# Patient Record
Sex: Female | Born: 1955 | Hispanic: Yes | Marital: Married | Smoking: Never smoker
Health system: Southern US, Community
[De-identification: ages and names within clinical notes are randomized; demographics above are authoritative.]

## PROBLEM LIST (undated history)

## (undated) DIAGNOSIS — E119 Type 2 diabetes mellitus without complications: Secondary | ICD-10-CM

## (undated) DIAGNOSIS — I10 Essential (primary) hypertension: Secondary | ICD-10-CM

## (undated) DIAGNOSIS — E079 Disorder of thyroid, unspecified: Secondary | ICD-10-CM

## (undated) HISTORY — PX: ABDOMINAL HYSTERECTOMY: SHX81

---

## 2004-04-15 ENCOUNTER — Ambulatory Visit: Payer: Self-pay | Admitting: Family Medicine

## 2004-05-14 ENCOUNTER — Ambulatory Visit: Payer: Self-pay | Admitting: Family Medicine

## 2005-06-02 ENCOUNTER — Ambulatory Visit: Payer: Self-pay | Admitting: Family Medicine

## 2006-08-18 ENCOUNTER — Ambulatory Visit: Payer: Self-pay | Admitting: Family Medicine

## 2011-11-28 ENCOUNTER — Emergency Department: Payer: Self-pay | Admitting: Emergency Medicine

## 2011-12-02 ENCOUNTER — Ambulatory Visit: Payer: Self-pay | Admitting: Specialist

## 2011-12-02 LAB — BASIC METABOLIC PANEL
Anion Gap: 12 (ref 7–16)
Calcium, Total: 9.2 mg/dL (ref 8.5–10.1)
Chloride: 100 mmol/L (ref 98–107)
Co2: 26 mmol/L (ref 21–32)
Creatinine: 0.55 mg/dL — ABNORMAL LOW (ref 0.60–1.30)
EGFR (Non-African Amer.): >60
Glucose: 203 mg/dL — ABNORMAL HIGH (ref 65–99)

## 2011-12-02 LAB — HEMOGLOBIN: HGB: 14.9 g/dL (ref 12.0–16.0)

## 2011-12-09 ENCOUNTER — Ambulatory Visit: Payer: Self-pay | Admitting: Specialist

## 2012-09-29 ENCOUNTER — Ambulatory Visit: Payer: Self-pay | Admitting: Family Medicine

## 2012-11-07 ENCOUNTER — Ambulatory Visit: Payer: Self-pay | Admitting: Family Medicine

## 2013-08-10 ENCOUNTER — Ambulatory Visit: Payer: Self-pay | Admitting: Family Medicine

## 2013-08-22 DIAGNOSIS — I10 Essential (primary) hypertension: Secondary | ICD-10-CM | POA: Insufficient documentation

## 2013-08-22 DIAGNOSIS — E0590 Thyrotoxicosis, unspecified without thyrotoxic crisis or storm: Secondary | ICD-10-CM | POA: Insufficient documentation

## 2013-11-22 ENCOUNTER — Other Ambulatory Visit (HOSPITAL_COMMUNITY): Payer: Self-pay | Admitting: Family Medicine

## 2013-11-22 ENCOUNTER — Other Ambulatory Visit: Payer: Self-pay | Admitting: Cardiology

## 2014-01-08 DIAGNOSIS — E785 Hyperlipidemia, unspecified: Secondary | ICD-10-CM | POA: Insufficient documentation

## 2014-07-03 NOTE — Op Note (Signed)
PATIENT NAME:  Katherine Walters, [name] MR#:  [id number] DATE OF BIRTH:  [birth year]  DATE OF PROCEDURE:  12/09/2011  PREOPERATIVE DIAGNOSIS: Comminuted fracture, right patella.   POSTOPERATIVE DIAGNOSIS: Comminuted fracture, right patella.  PROCEDURE: Open reduction internal fixation right patella with removal of several bone fragments.   SURGEON: Clare Gandyhristopher E. Izaiah Tabb, MD   ANESTHESIA: General.   COMPLICATIONS: None.   TOURNIQUET TIME: Approximately one hour.   DESCRIPTION OF PROCEDURE: 2 grams of Ancef were given intravenously prior to the procedure. General anesthesia is induced. Right lower extremity is thoroughly prepped with alcohol and ChloraPrep and draped in standard sterile fashion. Extremity is wrapped out with the Esmarch bandage and pneumatic tourniquet elevated to 325 mmHg. Transverse incision is made and the dissection carried down to the patella which is seen to be extremely comminuted. Two separate fragments were removed which were felt not to be able to be internally fixed. The joint is thoroughly irrigated multiple times of all clot. The two remaining large pieces of bone which are connected to the quadriceps tendon and the patellar tendon are retained feeling that bone healing could be obtained. The rongeur is used to shape these two ends as flat as possible and then internal fixation is performed with two longitudinal K wires with a circumferential 18-gauge wire with the ends twisted on each side. The undersurface of the patella is then palpated and there is felt to be as good as possible apposition of articular surface as could be obtained. The retinaculum is then thoroughly repaired with #5 Ethibond. The ends of the wires and the ends of the pins are buried as much as possible into the soft tissues. Wound is thoroughly irrigated multiple times again. The proximal portion of the wound is infiltrated with 30 mL of Marcaine with epinephrine. The prepatellar bursa is repaired over top of the  repair and then subcutaneous tissue is closed with 2-0 Vicryl. Skin is closed with the skin stapler. Soft bulky dressing is applied. Tourniquet is released. Knee immobilizer is applied.   The patient is returned to the recovery room in satisfactory condition having tolerated the procedure quite well.   ____________________________ Clare Gandyhristopher E. Orestes Geiman, MD ces:drc D: 12/09/2011 11:39:28 ET T: 12/09/2011 13:17:29 ET JOB#: [id number]  cc: Clare Gandyhristopher E. Eean Buss, MD, <Dictator> Clare GandyHRISTOPHER E [place] Yeats MD ELECTRONICALLY SIGNED 12/10/2011 8:34

## 2014-10-22 ENCOUNTER — Ambulatory Visit
Admission: RE | Admit: 2014-10-22 | Discharge: 2014-10-22 | Disposition: A | Discharge status: Home / Self Care | Payer: No Typology Code available for payment source | Source: Ambulatory Visit | Attending: Family Medicine | Admitting: Family Medicine

## 2014-10-22 ENCOUNTER — Other Ambulatory Visit: Payer: Self-pay | Admitting: Family Medicine

## 2014-10-22 DIAGNOSIS — Z1231 Encounter for screening mammogram for malignant neoplasm of breast: Secondary | ICD-10-CM | POA: Diagnosis not present

## 2014-12-14 DIAGNOSIS — Z794 Long term (current) use of insulin: Secondary | ICD-10-CM | POA: Insufficient documentation

## 2015-06-13 ENCOUNTER — Other Ambulatory Visit: Payer: Self-pay | Admitting: Family Medicine

## 2015-06-13 DIAGNOSIS — Z1231 Encounter for screening mammogram for malignant neoplasm of breast: Secondary | ICD-10-CM

## 2015-10-23 ENCOUNTER — Ambulatory Visit: Payer: No Typology Code available for payment source | Attending: Family Medicine

## 2018-10-10 ENCOUNTER — Other Ambulatory Visit: Payer: Self-pay | Admitting: Physician Assistant

## 2018-10-10 DIAGNOSIS — Z1231 Encounter for screening mammogram for malignant neoplasm of breast: Secondary | ICD-10-CM

## 2019-02-15 ENCOUNTER — Other Ambulatory Visit: Payer: Self-pay | Admitting: Physician Assistant

## 2019-02-15 DIAGNOSIS — Z1231 Encounter for screening mammogram for malignant neoplasm of breast: Secondary | ICD-10-CM

## 2019-09-06 ENCOUNTER — Ambulatory Visit
Admission: RE | Admit: 2019-09-06 | Discharge: 2019-09-06 | Disposition: A | Discharge status: Home / Self Care | Payer: Medicare Other | Source: Ambulatory Visit | Attending: Physician Assistant | Admitting: Physician Assistant

## 2019-09-06 DIAGNOSIS — Z1231 Encounter for screening mammogram for malignant neoplasm of breast: Secondary | ICD-10-CM

## 2019-11-16 ENCOUNTER — Other Ambulatory Visit: Payer: Self-pay | Admitting: Physician Assistant

## 2019-11-16 DIAGNOSIS — Z13820 Encounter for screening for osteoporosis: Secondary | ICD-10-CM

## 2020-08-22 ENCOUNTER — Other Ambulatory Visit: Payer: Self-pay | Admitting: Physician Assistant

## 2020-08-22 ENCOUNTER — Other Ambulatory Visit (INDEPENDENT_AMBULATORY_CARE_PROVIDER_SITE_OTHER): Payer: Self-pay | Admitting: Nurse Practitioner

## 2020-08-22 ENCOUNTER — Other Ambulatory Visit: Payer: Self-pay | Admitting: Primary Care

## 2020-08-22 DIAGNOSIS — I83893 Varicose veins of bilateral lower extremities with other complications: Secondary | ICD-10-CM

## 2020-08-22 DIAGNOSIS — Z1231 Encounter for screening mammogram for malignant neoplasm of breast: Secondary | ICD-10-CM

## 2020-08-23 ENCOUNTER — Ambulatory Visit (INDEPENDENT_AMBULATORY_CARE_PROVIDER_SITE_OTHER): Payer: Medicare Other | Admitting: Nurse Practitioner

## 2020-08-23 ENCOUNTER — Other Ambulatory Visit: Payer: Self-pay

## 2020-08-23 ENCOUNTER — Ambulatory Visit (INDEPENDENT_AMBULATORY_CARE_PROVIDER_SITE_OTHER): Payer: Medicare Other

## 2020-08-23 VITALS — BP 124/85 | HR 84 | Ht 63.0 in | Wt 190.0 lb

## 2020-08-23 DIAGNOSIS — I10 Essential (primary) hypertension: Secondary | ICD-10-CM | POA: Diagnosis not present

## 2020-08-23 DIAGNOSIS — Z794 Long term (current) use of insulin: Secondary | ICD-10-CM

## 2020-08-23 DIAGNOSIS — I83893 Varicose veins of bilateral lower extremities with other complications: Secondary | ICD-10-CM | POA: Diagnosis not present

## 2020-08-23 DIAGNOSIS — E119 Type 2 diabetes mellitus without complications: Secondary | ICD-10-CM

## 2020-08-25 ENCOUNTER — Encounter (INDEPENDENT_AMBULATORY_CARE_PROVIDER_SITE_OTHER): Payer: Self-pay | Admitting: Nurse Practitioner

## 2020-08-25 NOTE — Progress Notes (Signed)
Subjective:    Patient ID: Katherine Walters, female    DOB: Aug 27, 1955, 65 y.o.   MRN: [id number] Chief Complaint  Patient presents with   New Patient (Initial Visit)    Ven reflux  and consult     The patient is seen for evaluation of symptomatic varicose veins. The patient relates burning and stinging which worsened steadily throughout the course of the day, particularly with standing. The patient also notes an aching and throbbing pain over the varicosities, particularly with prolonged dependent positions. The symptoms are significantly improved with elevation.  The patient also notes that during hot weather the symptoms are greatly intensified. The patient states the pain from the varicose veins interferes with work, daily exercise, shopping and household maintenance. At this point, the symptoms are persistent and severe enough that they're having a negative impact on lifestyle and are interfering with daily activities.  The patient notes that on her right lower extremity there is an area of particular tenderness.  She notes that this area was red and painful but that has subsided mostly at this time.  There is no history of DVT, PE or superficial thrombophlebitis. There is no history of ulceration or hemorrhage. The patient denies a significant family history of varicose veins.  The patient has not worn graduated compression in the past. At the present time the patient has not been using over-the-counter analgesics. There is no history of prior surgical intervention or sclerotherapy.  Today noninvasive studies show no evidence of DVT seen bilaterally.  The patient does have evidence of reflux in the bilateral great saphenous veins at the knee and proximal calf.  It is also noted that in the distal thigh there is a partially occlusive subacute thrombus in the varicosity originating from the great saphenous vein.   Review of Systems  Cardiovascular:  Positive for leg swelling.  Skin:   Positive for color change.  All other systems reviewed and are negative.     Objective:   Physical Exam Vitals reviewed.  HENT:     Head: Normocephalic.  Cardiovascular:     Rate and Rhythm: Normal rate.     Pulses: Normal pulses.  Pulmonary:     Effort: Pulmonary effort is normal.  Musculoskeletal:        General: Tenderness present.  Skin:    General: Skin is warm and dry.  Neurological:     Mental Status: She is alert and oriented to person, place, and time.  Psychiatric:        Mood and Affect: Mood normal.        Behavior: Behavior normal.        Thought Content: Thought content normal.        Judgment: Judgment normal.    BP 124/85   Pulse 84   Ht 5\' 3"  (1.6 m)   Wt 190 lb (86.2 kg)   BMI 33.66 kg/m   History reviewed. No pertinent past medical history.  Social History   Socioeconomic History   Marital status: Married    Spouse name: Not on file   Number of children: Not on file   Years of education: Not on file   Highest education level: Not on file  Occupational History   Not on file  Tobacco Use   Smoking status: Never   Smokeless tobacco: Never  Substance and Sexual Activity   Alcohol use: Not on file   Drug use: Not on file   Sexual activity: Not on file  Other Topics Concern   Not on file  Social History Narrative   Not on file   Social Determinants of Health   Financial Resource Strain: Not on file  Food Insecurity: Not on file  Transportation Needs: Not on file  Physical Activity: Not on file  Stress: Not on file  Social Connections: Not on file  Intimate Partner Violence: Not on file    Past Surgical History:  Procedure Laterality Date   ABDOMINAL HYSTERECTOMY      History reviewed. No pertinent family history.  Allergies  Allergen Reactions   Iodine Other (See Comments)    CBC Latest Ref Rng & Units 12/02/2011  Hemoglobin 12.0 - 16.0 g/dL 16.1      CMP     Component Value Date/Time   NA 138 12/02/2011 1219   K  3.4 (L) 12/02/2011 1219   CL 100 12/02/2011 1219   CO2 26 12/02/2011 1219   GLUCOSE 203 (H) 12/02/2011 1219   BUN 12 12/02/2011 1219   CREATININE 0.55 (L) 12/02/2011 1219   CALCIUM 9.2 12/02/2011 1219   GFRNONAA >60 12/02/2011 1219   GFRAA >60 12/02/2011 1219     No results found.     Assessment & Plan:   1. Varicose veins of bilateral lower extremities with other complications  Recommend:  The patient has large symptomatic varicose veins that are painful and associated with swelling.  I have had a long discussion with the patient regarding  varicose veins and why they cause symptoms.  Patient will begin wearing graduated compression stockings class 1 on a daily basis, beginning first thing in the morning and removing them in the evening. The patient is instructed specifically not to sleep in the stockings.    The patient  will also begin using over-the-counter analgesics such as Motrin 600 mg po TID to help control the symptoms.    In addition, behavioral modification including elevation during the day will be initiated.    Pending the results of these changes the  patient will be reevaluated in three months.   An  ultrasound of the venous system will be obtained.   The patient is also advised to utilize full dose aspirin for the next 2 weeks to help with resolution of the thrombophlebitis.  Further plans will be based on the ultrasound results and whether conservative therapies are successful at eliminating the pain and swelling.    2. Type 2 diabetes mellitus without complication, with long-term current use of insulin (HCC) Continue hypoglycemic medications as already ordered, these medications have been reviewed and there are no changes at this time.  Hgb A1C to be monitored as already arranged by primary service   3. Benign essential hypertension Continue antihypertensive medications as already ordered, these medications have been reviewed and there are no changes at this  time.    Current Outpatient Medications on File Prior to Visit  Medication Sig Dispense Refill   aspirin 81 MG chewable tablet Chew by mouth.     cetirizine (ZYRTEC) 10 MG tablet Take by mouth.     glyBURIDE-metformin (GLUCOVANCE) 5-500 MG tablet Take 1 tablet by mouth 2 (two) times daily.     insulin NPH Human (NOVOLIN N) 100 UNIT/ML injection INJECT 10 TO 40 UNITS SUBCUTANEOUSLY TWICE DAILY WITH MEALS.     lisinopril-hydrochlorothiazide (ZESTORETIC) 20-25 MG tablet TAKE 1 TABLET BY MOUTH ONCE DAILY NEEDS  FOLLOW  UP  FOR  FURTHER  REFILLS     methimazole (TAPAZOLE) 10 MG tablet Take  1 tablet by mouth 2 (two) times daily.     OZEMPIC, 0.25 OR 0.5 MG/DOSE, 2 MG/1.5ML SOPN Inject 0.5 mg into the skin once a week.     terbinafine (LAMISIL) 250 MG tablet Take 250 mg by mouth daily.     triamcinolone cream (KENALOG) 0.1 % SMARTSIG:1 Sparingly Topical Twice Daily     No current facility-administered medications on file prior to visit.    There are no Patient Instructions on file for this visit. No follow-ups on file.   Georgiana Spinner, NP

## 2020-09-06 ENCOUNTER — Other Ambulatory Visit: Payer: Self-pay

## 2020-09-06 ENCOUNTER — Ambulatory Visit
Admission: RE | Admit: 2020-09-06 | Discharge: 2020-09-06 | Disposition: A | Discharge status: Home / Self Care | Payer: Medicare Other | Source: Ambulatory Visit | Attending: Primary Care | Admitting: Primary Care

## 2020-09-06 DIAGNOSIS — Z1231 Encounter for screening mammogram for malignant neoplasm of breast: Secondary | ICD-10-CM | POA: Insufficient documentation

## 2020-11-25 ENCOUNTER — Other Ambulatory Visit (INDEPENDENT_AMBULATORY_CARE_PROVIDER_SITE_OTHER): Payer: Self-pay | Admitting: Nurse Practitioner

## 2020-11-25 DIAGNOSIS — I8001 Phlebitis and thrombophlebitis of superficial vessels of right lower extremity: Secondary | ICD-10-CM

## 2020-11-26 ENCOUNTER — Ambulatory Visit (INDEPENDENT_AMBULATORY_CARE_PROVIDER_SITE_OTHER): Payer: Medicare Other | Admitting: Nurse Practitioner

## 2020-11-26 ENCOUNTER — Encounter (INDEPENDENT_AMBULATORY_CARE_PROVIDER_SITE_OTHER): Payer: Medicare Other

## 2021-07-08 ENCOUNTER — Other Ambulatory Visit: Payer: Self-pay | Admitting: Physician Assistant

## 2021-07-08 DIAGNOSIS — Z1231 Encounter for screening mammogram for malignant neoplasm of breast: Secondary | ICD-10-CM

## 2021-09-09 ENCOUNTER — Ambulatory Visit
Admission: RE | Admit: 2021-09-09 | Discharge: 2021-09-09 | Disposition: A | Discharge status: Home / Self Care | Payer: Medicare Other | Source: Ambulatory Visit | Attending: Physician Assistant | Admitting: Physician Assistant

## 2021-09-09 DIAGNOSIS — Z1231 Encounter for screening mammogram for malignant neoplasm of breast: Secondary | ICD-10-CM | POA: Diagnosis present

## 2022-01-12 ENCOUNTER — Encounter (INDEPENDENT_AMBULATORY_CARE_PROVIDER_SITE_OTHER): Payer: Self-pay

## 2022-05-21 ENCOUNTER — Encounter: Payer: Self-pay | Admitting: *Deleted

## 2022-06-10 ENCOUNTER — Encounter: Payer: Self-pay | Admitting: Internal Medicine

## 2022-06-10 ENCOUNTER — Ambulatory Visit: Payer: Medicare Other | Admitting: Internal Medicine

## 2022-06-10 VITALS — BP 120/80 | HR 81 | Ht 63.0 in | Wt 200.0 lb

## 2022-06-10 DIAGNOSIS — E042 Nontoxic multinodular goiter: Secondary | ICD-10-CM

## 2022-06-10 DIAGNOSIS — E0590 Thyrotoxicosis, unspecified without thyrotoxic crisis or storm: Secondary | ICD-10-CM

## 2022-06-10 DIAGNOSIS — M810 Age-related osteoporosis without current pathological fracture: Secondary | ICD-10-CM

## 2022-06-10 LAB — T4, FREE: Free T4: 1.23 ng/dL (ref 0.60–1.60)

## 2022-06-10 LAB — CBC
HCT: 43.5 % (ref 36.0–46.0)
Hemoglobin: 14.5 g/dL (ref 12.0–15.0)
MCHC: 33.4 g/dL (ref 30.0–36.0)
MCV: 83.4 fl (ref 78.0–100.0)
Platelets: 184 10*3/uL (ref 150.0–400.0)
RBC: 5.22 Mil/uL — ABNORMAL HIGH (ref 3.87–5.11)
RDW: 13.8 % (ref 11.5–15.5)
WBC: 8.2 10*3/uL (ref 4.0–10.5)

## 2022-06-10 LAB — VITAMIN D 25 HYDROXY (VIT D DEFICIENCY, FRACTURES): VITD: 20.44 ng/mL — ABNORMAL LOW (ref 30.00–100.00)

## 2022-06-10 LAB — COMPREHENSIVE METABOLIC PANEL
ALT: 18 U/L (ref 0–35)
AST: 21 U/L (ref 0–37)
Albumin: 4.1 g/dL (ref 3.5–5.2)
Alkaline Phosphatase: 155 U/L — ABNORMAL HIGH (ref 39–117)
BUN: 20 mg/dL (ref 6–23)
CO2: 28 mEq/L (ref 19–32)
Calcium: 9.2 mg/dL (ref 8.4–10.5)
Chloride: 104 mEq/L (ref 96–112)
Creatinine, Ser: 0.64 mg/dL (ref 0.40–1.20)
GFR: 90.62 mL/min (ref >60.00)
Glucose, Bld: 105 mg/dL — ABNORMAL HIGH (ref 70–99)
Potassium: 4 mEq/L (ref 3.5–5.1)
Sodium: 140 mEq/L (ref 135–145)
Total Bilirubin: 0.6 mg/dL (ref 0.2–1.2)
Total Protein: 7 g/dL (ref 6.0–8.3)

## 2022-06-10 LAB — TSH: TSH: 0 u[IU]/mL — ABNORMAL LOW (ref 0.35–5.50)

## 2022-06-10 MED ORDER — RISEDRONATE SODIUM 35 MG PO TABS: 35.0000 mg | ORAL_TABLET | ORAL | 3 refills | Status: DC

## 2022-06-10 NOTE — Progress Notes (Unsigned)
Name: Katherine Walters  MRN/ DOB: [id number], 1955/06/09    Age/ Sex: 67 y.o., female    PCP: Kerri Perches, Hershal Coria   Reason for Endocrinology Evaluation: Osteoporosis     Date of Initial Endocrinology Evaluation: 06/10/2022     HPI: Ms. Katherine Walters is a 67 y.o. female with a past medical history of HTN, DM, MNG, GERD. The patient presented for initial endocrinology clinic visit on 06/10/2022 for consultative assistance with her osteoporosis.    She is accompanied by an interpretor   Pt was diagnosed with osteoporosis: 08/2022  Menarche at age : 79 Menopausal at age : hysterectomy at age 89 Fracture Hx: Right patellar fracture after a slip injury  Hx of HRT: no FH of osteoporosis or hip fracture: no Prior Hx of anti-estrogenic therapy :no Prior Hx of anti-resorptive therapy : Alendronate 08/2021 but stopped it due GERD   She takes calcium incorporated in her lipid lowering agent Denies taking extra calcium or vitamin D  She is not on MVI     THYROID HISTORY: Of note, the patient has been diagnosed with multinodular goiter based on ultrasound 10/2012.  The largest hypoechoic nodule in the left inferior thyroid pole 2.5 x 2.9 x 4 cm .  She underwent thyroid uptake and scan 07/2013 with large hyperfunctioning nodule within the left lobe of the thyroid gland.  With suppression of the right lobe of the thyroid gland.   The patient is on methimazole 10 mg daily ( Prescription states BID)  She has been on Methimazole for 3 years Denies local neck swelling  Has occasional palpitations but no tremors  Denies loose stools or diarrhea  Denies Fh of thyroid disease     HISTORY:  Past Medical History: No past medical history on file. Past Surgical History:  Past Surgical History:  Procedure Laterality Date   ABDOMINAL HYSTERECTOMY      Social History:  reports that she has never smoked. She has never used smokeless tobacco. Family History: family history is not on  file.   HOME MEDICATIONS: Allergies as of 06/10/2022       Reactions   Iodine Other (See Comments)        Medication List        Accurate as of June 10, 2022 12:01 PM. If you have any questions, ask your nurse or doctor.          STOP taking these medications    glyBURIDE-metformin 5-500 MG tablet Commonly known as: GLUCOVANCE Stopped by: Dorita Sciara, MD   insulin NPH Human 100 UNIT/ML injection Commonly known as: NOVOLIN N Stopped by: Dorita Sciara, MD   Ozempic (0.25 or 0.5 MG/DOSE) 2 MG/1.5ML Sopn Generic drug: Semaglutide(0.25 or 0.5MG /DOS) Stopped by: Dorita Sciara, MD       TAKE these medications    aspirin 81 MG chewable tablet Chew by mouth.   cetirizine 10 MG tablet Commonly known as: ZYRTEC Take by mouth.   lisinopril-hydrochlorothiazide 20-25 MG tablet Commonly known as: ZESTORETIC TAKE 1 TABLET BY MOUTH ONCE DAILY NEEDS  FOLLOW  UP  FOR  FURTHER  REFILLS   methimazole 10 MG tablet Commonly known as: TAPAZOLE Take 1 tablet by mouth 2 (two) times daily.   terbinafine 250 MG tablet Commonly known as: LAMISIL Take 250 mg by mouth daily.   triamcinolone cream 0.1 % Commonly known as: KENALOG SMARTSIG:1 Sparingly Topical Twice Daily   Victoza 18 MG/3ML Sopn Generic drug: liraglutide Inject  1.2 mg into the skin daily.          REVIEW OF SYSTEMS: A comprehensive ROS was conducted with the patient and is negative except as per HPI    OBJECTIVE:  VS: BP 120/80 (BP Location: Left Arm, Patient Position: Sitting, Cuff Size: Large)   Pulse 81   Ht 5\' 3"  (1.6 m)   Wt 200 lb (90.7 kg)   SpO2 99%   BMI 35.43 kg/m    Wt Readings from Last 3 Encounters:  06/10/22 200 lb (90.7 kg)  08/23/20 190 lb (86.2 kg)     EXAM: General: Pt appears well and is in NAD  Eyes: External eye exam normal without stare, lid lag or exophthalmos.  EOM intact.  Neck: General: Supple without adenopathy. Thyroid: Thyroid size  normal.  No goiter or nodules appreciated. No thyroid bruit.  Lungs: Clear with good BS bilat with no rales, rhonchi, or wheezes  Heart: Auscultation: RRR.  Abdomen: Normoactive bowel sounds, soft, nontender, without masses or organomegaly palpable  Extremities:  BL LE: No pretibial edema normal ROM and strength.  Mental Status: Judgment, insight: Intact Orientation: Oriented to time, place, and person Mood and affect: No depression, anxiety, or agitation     DATA REVIEWED: ***   DXA 08/14/2021 @[facility] [phone] AP spine -2.2 Left femoral neck -2.5 Left total hip -0.8  ASSESSMENT/PLAN/RECOMMENDATIONS:   Osteoporosis    Medications :   2.Hyperthyroidism    3. MNG:  - NO locak neck symptoms    Signed electronically by: Mack Guise, MD  [facility]  [facility] [address] [place], [place] [address] Phone: [phone] FAX: [phone]   CC: [address] [place] [address] Phone: [phone] Fax: [phone]   Return to Endocrinology clinic as below: No future appointments.

## 2022-06-10 NOTE — Patient Instructions (Addendum)
  Tome tabletas de calcio de venta libre de 600 mg [employer] al da o consuma 2-3 porciones de calcio diettico a travs de los alimentos (leche baja en [place], [place], yogur o vegetales de hojas verdes).   Por favor, comience con Actonel (risedronato) 35 mg una vez a la semana.      Please either take over the counter calcium tablets 600 mg twice daily or consume 2-3 servings of dietary calcium through food ( Low fat milk, cheese, yogurt or green leafy vegetables)   Please Start Actonel ( Risedronate) 35 mg once weekly

## 2022-06-11 ENCOUNTER — Telehealth: Payer: Self-pay | Admitting: Internal Medicine

## 2022-06-11 LAB — PARATHYROID HORMONE, INTACT (NO CA): PTH: 37 pg/mL (ref 16–77)

## 2022-06-11 LAB — T3: T3, Total: 129 ng/dL (ref 76–181)

## 2022-06-11 MED ORDER — VITAMIN D3 50 MCG (2000 UT) PO CAPS: 2000.0000 [IU] | ORAL_CAPSULE | Freq: Every day | ORAL | 2 refills | Status: AC

## 2022-06-11 MED ORDER — METHIMAZOLE 10 MG PO TABS: 10.0000 mg | ORAL_TABLET | Freq: Two times a day (BID) | ORAL | 2 refills | Status: DC

## 2022-06-11 NOTE — Telephone Encounter (Signed)
Please let the patient know that her thyroid is overactive, and she needs to increase methimazole to 2 tablets daily   Please let the patient know that her vitamin D is low, and she needs to start over-the-counter vitamin D at 2000 IU daily   Thanks

## 2022-06-11 NOTE — Telephone Encounter (Signed)
Notified pt with interpreter--regarding  lab results and instruction. Pt verbalized understanding. Pt stated-risedronate (ACTONEL) 35 MG --cost $135.00 with insurance and pt questioning if any alternatives. Please advise.

## 2022-06-22 ENCOUNTER — Other Ambulatory Visit (HOSPITAL_COMMUNITY): Payer: Self-pay

## 2022-06-22 ENCOUNTER — Ambulatory Visit
Admission: RE | Admit: 2022-06-22 | Discharge: 2022-06-22 | Disposition: A | Discharge status: Home / Self Care | Payer: Medicare Other | Source: Ambulatory Visit | Attending: Internal Medicine | Admitting: Internal Medicine

## 2022-06-22 ENCOUNTER — Telehealth: Payer: Self-pay

## 2022-06-22 DIAGNOSIS — E042 Nontoxic multinodular goiter: Secondary | ICD-10-CM

## 2022-06-22 NOTE — Telephone Encounter (Signed)
Patient Advocate Encounter   Received notification from pt msgs that prior authorization is required for Risedronate Sodium 35MG  dr tablets  Submitted: 06/22/22 Key [id number]  Status is pending

## 2022-06-23 ENCOUNTER — Telehealth: Payer: Self-pay | Admitting: Internal Medicine

## 2022-06-23 DIAGNOSIS — E042 Nontoxic multinodular goiter: Secondary | ICD-10-CM

## 2022-06-23 NOTE — Telephone Encounter (Signed)
Pharmacy Patient Advocate Encounter  Prior Authorization for Risedronate Sodium 35MG  dr tablets  has been approved by [employer] (ins).    PA # [id number] Effective dates: 06/22/22 through 03/16/23

## 2022-06-23 NOTE — Telephone Encounter (Signed)
Please let the patient know that her thyroid ultrasound continues to show that she has thyroid nodules.   2 of the nodules need to be biopsied  They will usually do this at the same place where they had ultrasound, local anesthesia will be applied but she will remain awake through the process.  Usually the results take approximately 1 week and we would let her know about it    Please take permission from the patient to proceed with biopsy of the 2 thyroid nodules  Please let me know so I can place the orders  Thank you

## 2022-06-24 NOTE — Telephone Encounter (Signed)
Spoke with patient and her daughter and she would like to do biopsy

## 2022-06-26 NOTE — Telephone Encounter (Signed)
FNA orders placed

## 2022-07-07 ENCOUNTER — Ambulatory Visit
Admission: RE | Admit: 2022-07-07 | Discharge: 2022-07-07 | Disposition: A | Discharge status: Home / Self Care | Payer: Medicare Other | Source: Ambulatory Visit | Attending: Internal Medicine | Admitting: Internal Medicine

## 2022-07-07 ENCOUNTER — Other Ambulatory Visit (HOSPITAL_COMMUNITY)
Admission: RE | Admit: 2022-07-07 | Discharge: 2022-07-07 | Disposition: A | Discharge status: Home / Self Care | Payer: Medicare Other | Source: Ambulatory Visit | Attending: Interventional Radiology | Admitting: Interventional Radiology

## 2022-07-07 DIAGNOSIS — E042 Nontoxic multinodular goiter: Secondary | ICD-10-CM

## 2022-07-07 DIAGNOSIS — R896 Abnormal cytological findings in specimens from other organs, systems and tissues: Secondary | ICD-10-CM | POA: Diagnosis not present

## 2022-07-07 DIAGNOSIS — E041 Nontoxic single thyroid nodule: Secondary | ICD-10-CM | POA: Diagnosis present

## 2022-07-08 LAB — CYTOLOGY - NON PAP

## 2022-07-21 ENCOUNTER — Encounter (HOSPITAL_COMMUNITY): Payer: Self-pay

## 2022-08-19 ENCOUNTER — Telehealth: Payer: Self-pay

## 2022-08-19 NOTE — Telephone Encounter (Signed)
Pt received letter to schedule colonoscopy please return call 

## 2022-08-21 ENCOUNTER — Other Ambulatory Visit: Payer: Self-pay | Admitting: Physician Assistant

## 2022-08-21 ENCOUNTER — Ambulatory Visit: Payer: Medicare Other | Admitting: Internal Medicine

## 2022-08-21 ENCOUNTER — Encounter: Payer: Self-pay | Admitting: Internal Medicine

## 2022-08-21 VITALS — BP 138/88 | HR 89 | Ht 63.0 in | Wt 199.4 lb

## 2022-08-21 DIAGNOSIS — E042 Nontoxic multinodular goiter: Secondary | ICD-10-CM

## 2022-08-21 DIAGNOSIS — M810 Age-related osteoporosis without current pathological fracture: Secondary | ICD-10-CM

## 2022-08-21 DIAGNOSIS — E0590 Thyrotoxicosis, unspecified without thyrotoxic crisis or storm: Secondary | ICD-10-CM

## 2022-08-21 DIAGNOSIS — Z1231 Encounter for screening mammogram for malignant neoplasm of breast: Secondary | ICD-10-CM

## 2022-08-21 LAB — BASIC METABOLIC PANEL
BUN: 21 mg/dL (ref 6–23)
CO2: 27 mEq/L (ref 19–32)
Calcium: 9.4 mg/dL (ref 8.4–10.5)
Chloride: 102 mEq/L (ref 96–112)
Creatinine, Ser: 0.76 mg/dL (ref 0.40–1.20)
GFR: 80.24 mL/min (ref >60.00)
Glucose, Bld: 156 mg/dL — ABNORMAL HIGH (ref 70–99)
Potassium: 4.2 mEq/L (ref 3.5–5.1)
Sodium: 140 mEq/L (ref 135–145)

## 2022-08-21 LAB — VITAMIN D 25 HYDROXY (VIT D DEFICIENCY, FRACTURES): VITD: 41.71 ng/mL (ref 30.00–100.00)

## 2022-08-21 LAB — TSH: TSH: 0.4 u[IU]/mL (ref 0.35–5.50)

## 2022-08-21 LAB — T4, FREE: Free T4: 0.57 ng/dL — ABNORMAL LOW (ref 0.60–1.60)

## 2022-08-21 MED ORDER — ALENDRONATE SODIUM 70 MG PO TABS: 70.0000 mg | ORAL_TABLET | ORAL | 3 refills | Status: AC

## 2022-08-21 NOTE — Progress Notes (Unsigned)
Name: Katherine Walters  MRN/ DOB: [id number], 12-30-1955    Age/ Sex: 67 y.o., female    PCP: Marya Fossa, Cordelia Poche   Reason for Endocrinology Evaluation: Osteoporosis     Date of Initial Endocrinology Evaluation: 06/10/2022    HPI: Katherine Walters is a 67 y.o. female with a past medical history of HTN, DM, MNG, GERD. The patient presented for initial endocrinology clinic visit on 06/10/2022 for consultative assistance with her osteoporosis.     Pt was diagnosed with osteoporosis: 08/2022  Menarche at age : 38 Menopausal at age : hysterectomy at age 31 Fracture Hx: Right patellar fracture after a slip injury  Hx of HRT: no FH of osteoporosis or hip fracture: no Prior Hx of anti-estrogenic therapy :no Prior Hx of anti-resorptive therapy : Alendronate 08/2021 but stopped it due GERD   She takes calcium incorporated in her lipid lowering agent Denies taking extra calcium or vitamin D  She is not on MVI     THYROID HISTORY: Of note, the patient has been diagnosed with multinodular goiter based on ultrasound 10/2012.  The largest hypoechoic nodule in the left inferior thyroid pole 2.5 x 2.9 x 4 cm .  She underwent thyroid uptake and scan 07/2013 with large hyperfunctioning nodule within the left lobe of the thyroid gland.  With suppression of the right lobe of the thyroid gland.   She has been on methimazole since 2021   Denies Fh of thyroid disease     SUBJECTIVE:    Today (08/21/22):  Katherine Walters is here for follow-up on osteoporosis and multinodular goiter.  She is accompanied by her daughter and spouse  Denies local neck swelling  Has occasional palpitations  Denies diarrhea but has occasional constipation   She received calcium through the foot   Denies loose stools or diarrhea    Methimazole 10 mg twice daily Risedronate 35 mg weekly Vitamin D3 4000 IU daily  HISTORY:  Past Medical History: No past medical history on file. Past Surgical  History:  Past Surgical History:  Procedure Laterality Date   ABDOMINAL HYSTERECTOMY      Social History:  reports that she has never smoked. She has never used smokeless tobacco. Family History: family history is not on file.   HOME MEDICATIONS: Allergies as of 08/21/2022       Reactions   Iodine Other (See Comments)        Medication List        Accurate as of August 21, 2022  9:51 AM. If you have any questions, ask your nurse or doctor.          aspirin 81 MG chewable tablet Chew by mouth.   cetirizine 10 MG tablet Commonly known as: ZYRTEC Take by mouth.   lisinopril-hydrochlorothiazide 20-25 MG tablet Commonly known as: ZESTORETIC TAKE 1 TABLET BY MOUTH ONCE DAILY NEEDS  FOLLOW  UP  FOR  FURTHER  REFILLS   methimazole 10 MG tablet Commonly known as: TAPAZOLE Take 1 tablet (10 mg total) by mouth 2 (two) times daily.   risedronate 35 MG tablet Commonly known as: Actonel Take 1 tablet (35 mg total) by mouth every 7 (seven) days. with water on empty stomach, nothing by mouth or lie down for next 30 minutes.   terbinafine 250 MG tablet Commonly known as: LAMISIL Take 250 mg by mouth daily.   triamcinolone cream 0.1 % Commonly known as: KENALOG SMARTSIG:1 Sparingly Topical Twice Daily  Victoza 18 MG/3ML Sopn Generic drug: liraglutide Inject 1.2 mg into the skin daily.   Vitamin D3 50 MCG (2000 UT) capsule Take 1 capsule (2,000 Units total) by mouth daily.          REVIEW OF SYSTEMS: A comprehensive ROS was conducted with the patient and is negative except as per HPI    OBJECTIVE:  VS: There were no vitals taken for this visit.   Wt Readings from Last 3 Encounters:  06/10/22 200 lb (90.7 kg)  08/23/20 190 lb (86.2 kg)     EXAM: General: Pt appears well and is in NAD  Eyes: External eye exam normal without stare, lid lag or exophthalmos.  EOM in  Neck: General: Supple without adenopathy. Thyroid: Unable to appreciate any  goiter. No thyroid  bruit.  Lungs: Clear with good BS bilat   Heart: Auscultation: RRR.  Abdomen:  soft, nontender  Extremities:  BL LE: No pretibial edema normal  Mental Status: Judgment, insight: Intact Orientation: Oriented to time, place, and person Mood and affect: No depression, anxiety, or agitation     DATA REVIEWED:   Latest Reference Range & Units 08/21/22 10:17  Sodium 135 - 145 mEq/L 140  Potassium 3.5 - 5.1 mEq/L 4.2  Chloride 96 - 112 mEq/L 102  CO2 19 - 32 mEq/L 27  Glucose 70 - 99 mg/dL 161 (H)  BUN 6 - 23 mg/dL 21  Creatinine 0.96 - 0.45 mg/dL 4.09  Calcium 8.4 - 81.1 mg/dL 9.4  GFR >91.47 mL/min 80.24  VITD 30.00 - 100.00 ng/mL 41.71     Latest Reference Range & Units 08/21/22 10:17  TSH 0.35 - 5.50 uIU/mL 0.40  T4,Free(Direct) 0.60 - 1.60 ng/dL 8.29 (L)  (L): Data is abnormally low     DXA 08/14/2021 @[facility] [phone] AP spine -2.2 Left femoral neck -2.5 Left total hip -0.8    Thyroid ultrasound 06/22/2022 FINDINGS: Parenchymal Echotexture: Moderately heterogenous   Isthmus: 0.2 cm   Right lobe: 4.2 x 1.2 x 1.4 cm   Left lobe: 6.0 x 3.1 x 3.0 cm   _________________________________________________________   Estimated total number of nodules >/= 1 cm: 4   Number of spongiform nodules >/=  2 cm not described below (TR1): 0   Number of mixed cystic and solid nodules >/= 1.5 cm not described below (TR2): 0   _________________________________________________________   Nodule # 1:   Prior biopsy: No   Location: Isthmus; Mid, left   Maximum size: 3.4 cm; Other 2 dimensions: 1.3 x 2.8 cm, previously, 0.7 x 0.4 x 0.8 cm   Composition: solid/almost completely solid (2)   Echogenicity: hypoechoic (2)   Shape: not taller-than-wide (0)   Margins: smooth (0)   Echogenic foci: none (0)   ACR TI-RADS total points: 4.   ACR TI-RADS risk category:  TR4 (4-6 points).   Significant change in size (>/= 20% in two dimensions and  minimal increase of 2 mm): Yes   Change in features: No   Change in ACR TI-RADS risk category: No   ACR TI-RADS recommendations:   **Given size (>/= 1.5 cm) and appearance, fine needle aspiration of this moderately suspicious nodule should be considered based on TI-RADS criteria.   _________________________________________________________   Nodule #2 is a hypoechoic nodule along the right side of the isthmus that measures 0.8 x 0.2 x 0.5 cm. This is a TR 4 nodule. Given size (<0.9 cm) and appearance, this nodule does NOT meet TI-RADS criteria for biopsy or  dedicated follow-up.   Nodule #3 is a cystic nodule in the superior right thyroid lobe that measures up to 0.8 cm and does not require dedicated follow-up.   Nodule #4 is a mixed cystic and solid nodule in the mid right thyroid lobe that measures 1.2 x 0.7 x 0.8 cm. Solid component is isoechoic. This nodule does not require dedicated follow-up.   Nodule # 5:   Prior biopsy: No   Location: Left; mid/superior   Maximum size: 1.5 cm; Other 2 dimensions: 1.4 x 0.8 cm, previously, 1.3 x 0.9 x 1.4 cm   Composition: solid/almost completely solid (2)   Echogenicity: hypoechoic (2)   Shape: not taller-than-wide (0)   Margins: smooth (0)   Echogenic foci: macrocalcifications (1)   ACR TI-RADS total points: 5.   ACR TI-RADS risk category:  TR4 (4-6 points).   Significant change in size (>/= 20% in two dimensions and minimal increase of 2 mm): No   Change in features: No   Change in ACR TI-RADS risk category: No   ACR TI-RADS recommendations:   Nodule has minimally changed since 2014 and most compatible with a benign nodule.   _________________________________________________________   Nodule # 6:   Prior biopsy: No   Location: Left; Inferior   Maximum size: 4.7 cm; Other 2 dimensions: 2.1 x 3.4 cm, previously, 4.0 x 2.9 x 2.5 cm   Composition: solid/almost completely solid (2)   Echogenicity:  hypoechoic (2)   Shape: not taller-than-wide (0)   Margins: smooth (0)   Echogenic foci: macrocalcifications (1)   ACR TI-RADS total points: 5.   ACR TI-RADS risk category:  TR4 (4-6 points).   Significant change in size (>/= 20% in two dimensions and minimal increase of 2 mm): Yes   Change in features: No   Change in ACR TI-RADS risk category: No   ACR TI-RADS recommendations:   **Given size (>/= 1.5 cm) and appearance, fine needle aspiration of this moderately suspicious nodule should be considered based on TI-RADS criteria.   _________________________________________________________   IMPRESSION: 1. Multinodular goiter. 2. Nodule #1 along the left side of the isthmus has significantly enlarged since 2014. This is a TR 4 nodule and recommend ultrasound-guided fine-needle aspiration of this nodule. 3. Nodule #6 in the inferior left thyroid lobe has slightly enlarged since 2014. Consider ultrasound-guided biopsy of this nodule. 4. Nodule #5 in the left mid thyroid is a TR 4 nodule but has not significantly changed since 2014. This is most likely a benign nodule.    FNA Left  isthmic 3.4 cm nodule 07/07/2022  Clinical History: Isthmus; Mid, Left 3.4cm;  Other 2 dimensions: 1.3 x  2.8cm, previously 0.7 x 0.4 x 0.8cm, solid/almost completely solid  hypoechoic TI-RADS - 4  Specimen Submitted:  A. 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 THYROID, LEFT INFERIOR, FINE NEEDLE ASPIRATION:    FINAL MICROSCOPIC DIAGNOSIS:  - Atypia of undetermined significance (Bethesda category III)    Afirma  Benign   ASSESSMENT/PLAN/RECOMMENDATIONS:   Osteoporosis  -We emphasized the importance of optimizing calcium and vitamin D - she was started on alendronate  through her PCP in 2023, she initially did not recall being on any medications for osteoporosis but she eventually stated that she did develop GI side effects -During her initial visit with me we discussed zoledronic acid versus Prolia, but she opted with Actonel, she eventually opted to try alendronate again due to the cost of Actonel -BMP, and vitamin D normal  Medications : Calcium 1200 mg daily Stop risedronate 35 mg weekly Start alendronate 70 mg weekly    2.Hyperthyroidism  -Per patient, she has been on methimazole for a few years -TSH normalized, but free T4 is low, will decrease methimazole as below    Medication Decrease methimazole 10 mg, 1.5 tab  daily  3. MNG:  - NO locak neck symptoms  -She was diagnosed with multinodular goiter in 2014, and uptake and scan showed a toxic left thyroid nodule -She is s/p isthmic 3.4 cm and left inferior 4.7 cm nodules/2024 with atypia of undetermined significance (Bethesda category IV) , Afirma was benign for both     4.  Vitamin D insufficiency:  -Resolved  Continue OTC vitamin D3 4000 IU daily   Follow-up in 6 months   Signed electronically by: Lyndle Herrlich, MD  [facility] Endocrinology  [facility] [address] [place] [place], [place] [address] Phone: [phone] FAX: [phone]   CC: Cyndi Bender [address] [place] [address] Phone: [phone] Fax: [phone]   Return to Endocrinology clinic as below: Future Appointments  Date Time Provider Department Center  09/11/2022  9:00 AM [facility] MM [facility]2 [facility]

## 2022-08-21 NOTE — Patient Instructions (Addendum)
Methimazole 10 mg twice daily Stop Risedronate 35 mg weekly Start Fosamax (Alendronate ) 70 mg weekly  Vitamin D3 4000 IU daily

## 2022-08-24 ENCOUNTER — Telehealth: Payer: Self-pay

## 2022-08-24 ENCOUNTER — Other Ambulatory Visit: Payer: Self-pay

## 2022-08-24 DIAGNOSIS — Z1211 Encounter for screening for malignant neoplasm of colon: Secondary | ICD-10-CM

## 2022-08-24 DIAGNOSIS — R195 Other fecal abnormalities: Secondary | ICD-10-CM

## 2022-08-24 MED ORDER — NA SULFATE-K SULFATE-MG SULF 17.5-3.13-1.6 GM/177ML PO SOLN: 1.0000 | Freq: Once | ORAL | 0 refills | Status: AC

## 2022-08-24 MED ORDER — METHIMAZOLE 10 MG PO TABS: 15.0000 mg | ORAL_TABLET | Freq: Every day | ORAL | 2 refills | Status: DC

## 2022-08-24 NOTE — Telephone Encounter (Signed)
Gastroenterology Pre-Procedure Review  Request Date: 10/19/22 Requesting Physician: Dr. Tobi Bastos  Scheduled with the assistance of [facility] [employer] ID [id number] Sophia.  PATIENT REVIEW QUESTIONS: The patient responded to the following health history questions as indicated:    1. Are you having any GI issues? yes (heme positive stool noted in referral.  Patient stated that she had blood in stool in the past but not now. 1st colonoscopy) 2. Do you have a personal history of Polyps? no 3. Do you have a family history of Colon Cancer or Polyps? no 4. Diabetes Mellitus? yes (Patient has been advised that she will need to stop Victoza 7 days prior to colonoscopy.  Stop Januvia 3 days prior to colonoscopy.  Lantus 1/2 usual dose the day before the procedure) 5. Joint replacements in the past 12 months?no 6. Major health problems in the past 3 months?no 7. Any artificial heart valves, MVP, or defibrillator?no    MEDICATIONS & ALLERGIES:    Patient reports the following regarding taking any anticoagulation/antiplatelet therapy:   Plavix, Coumadin, Eliquis, Xarelto, Lovenox, Pradaxa, Brilinta, or Effient? no Aspirin? yes (81 mg daily)  Patient confirms/reports the following medications:  Current Outpatient Medications  Medication Sig Dispense Refill   alendronate (FOSAMAX) 70 MG tablet Take 1 tablet (70 mg total) by mouth every 7 (seven) days. Take with a full glass of water on an empty stomach. 12 tablet 3   aspirin 81 MG chewable tablet Chew by mouth.     cetirizine (ZYRTEC) 10 MG tablet Take by mouth.     Cholecalciferol (VITAMIN D3) 50 MCG (2000 UT) CAPS Take 1 capsule (2,000 Units total) by mouth daily. 90 capsule 2   liraglutide (VICTOZA) 18 MG/3ML SOPN Inject 1.2 mg into the skin daily.     lisinopril-hydrochlorothiazide (ZESTORETIC) 20-25 MG tablet TAKE 1 TABLET BY MOUTH ONCE DAILY NEEDS  FOLLOW  UP  FOR  FURTHER  REFILLS     methimazole (TAPAZOLE) 10 MG tablet Take 1 tablet (10 mg total)  by mouth 2 (two) times daily. 180 tablet 2   terbinafine (LAMISIL) 250 MG tablet Take 250 mg by mouth daily.     triamcinolone cream (KENALOG) 0.1 % SMARTSIG:1 Sparingly Topical Twice Daily     No current facility-administered medications for this visit.    Patient confirms/reports the following allergies:  Allergies  Allergen Reactions   Iodine Other (See Comments)    No orders of the defined types were placed in this encounter.   AUTHORIZATION INFORMATION Primary Insurance: 1D#: Group #:  Secondary Insurance: 1D#: Group #:  SCHEDULE INFORMATION: Date: 10/19/22 Time: Location: [facility]

## 2022-09-11 ENCOUNTER — Ambulatory Visit
Admission: RE | Admit: 2022-09-11 | Discharge: 2022-09-11 | Disposition: A | Discharge status: Home / Self Care | Payer: Medicare Other | Source: Ambulatory Visit | Attending: Physician Assistant | Admitting: Physician Assistant

## 2022-09-11 DIAGNOSIS — Z1231 Encounter for screening mammogram for malignant neoplasm of breast: Secondary | ICD-10-CM | POA: Insufficient documentation

## 2022-09-28 IMAGING — MG MM DIGITAL SCREENING BILAT W/ TOMO AND CAD
6 of 10 series · 6 of 30 positions shown · non-contrast
Comparison: Previous exam(s).

CLINICAL DATA: Screening.

EXAM:
DIGITAL SCREENING BILATERAL MAMMOGRAM WITH TOMOSYNTHESIS AND CAD
TECHNIQUE: Bilateral screening digital craniocaudal and mediolateral oblique
mammograms were obtained. Bilateral screening digital breast
tomosynthesis was performed. The images were evaluated with
computer-aided detection.

[L MLO synth-2D]
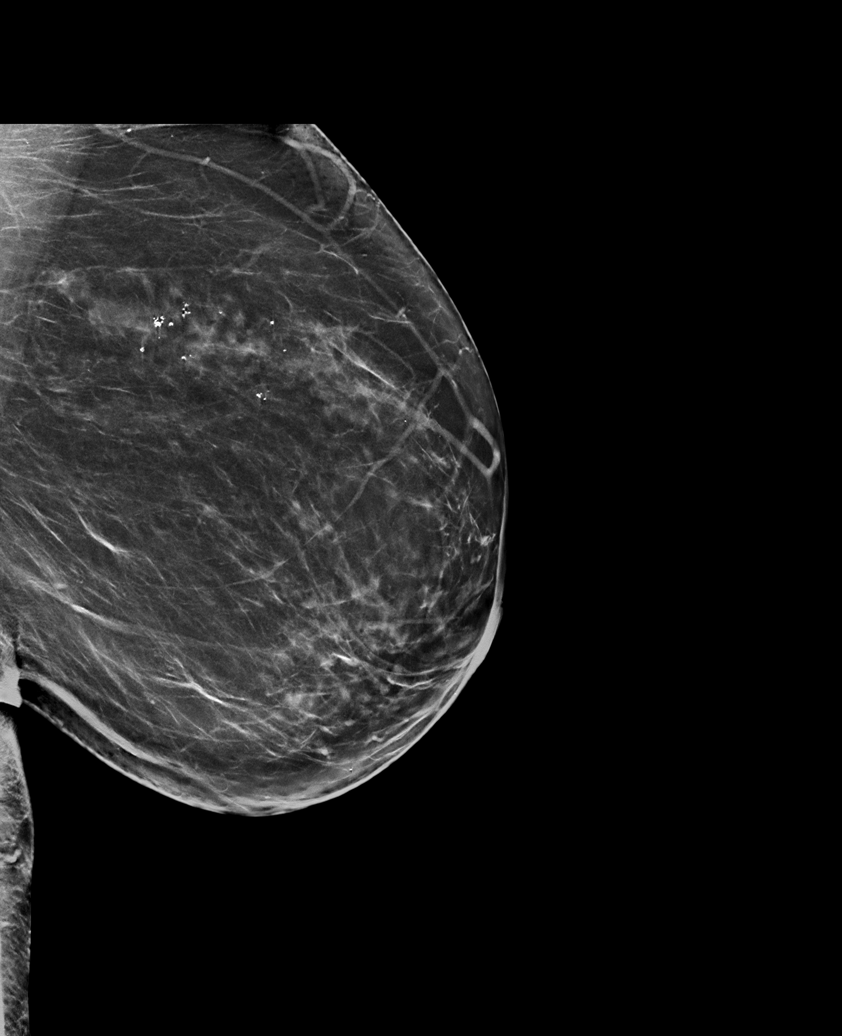

[R CC synth-2D]
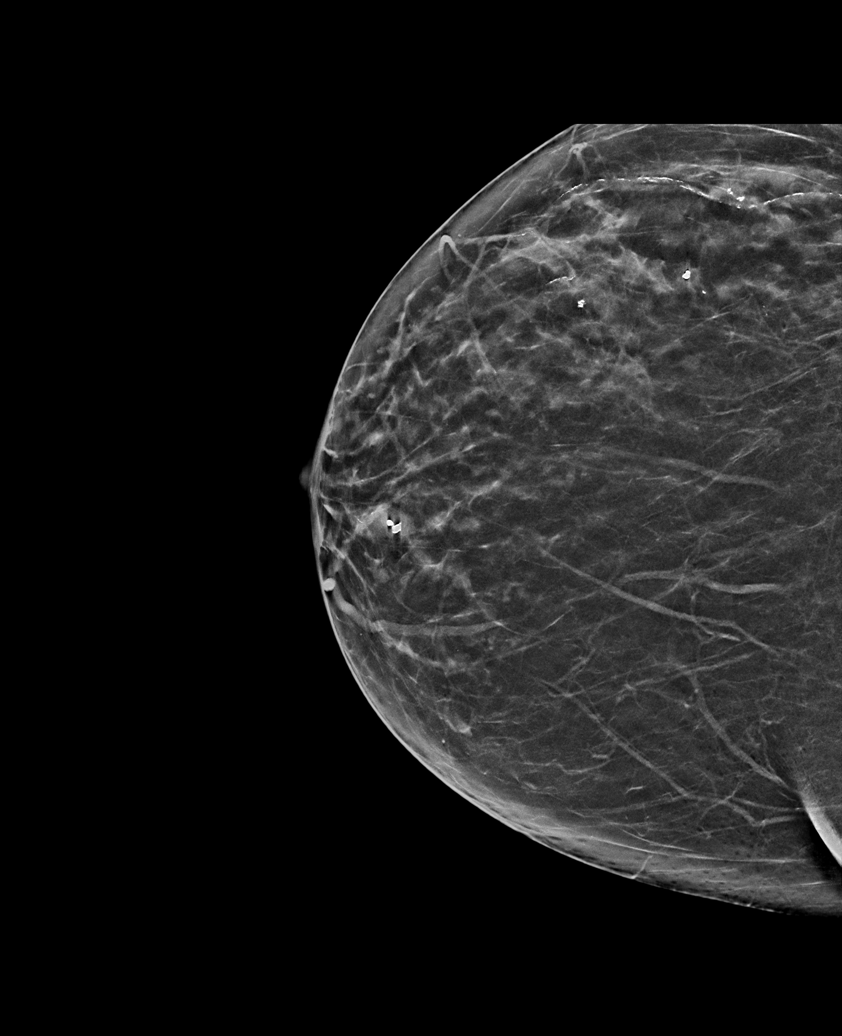

[R MLO synth-2D]
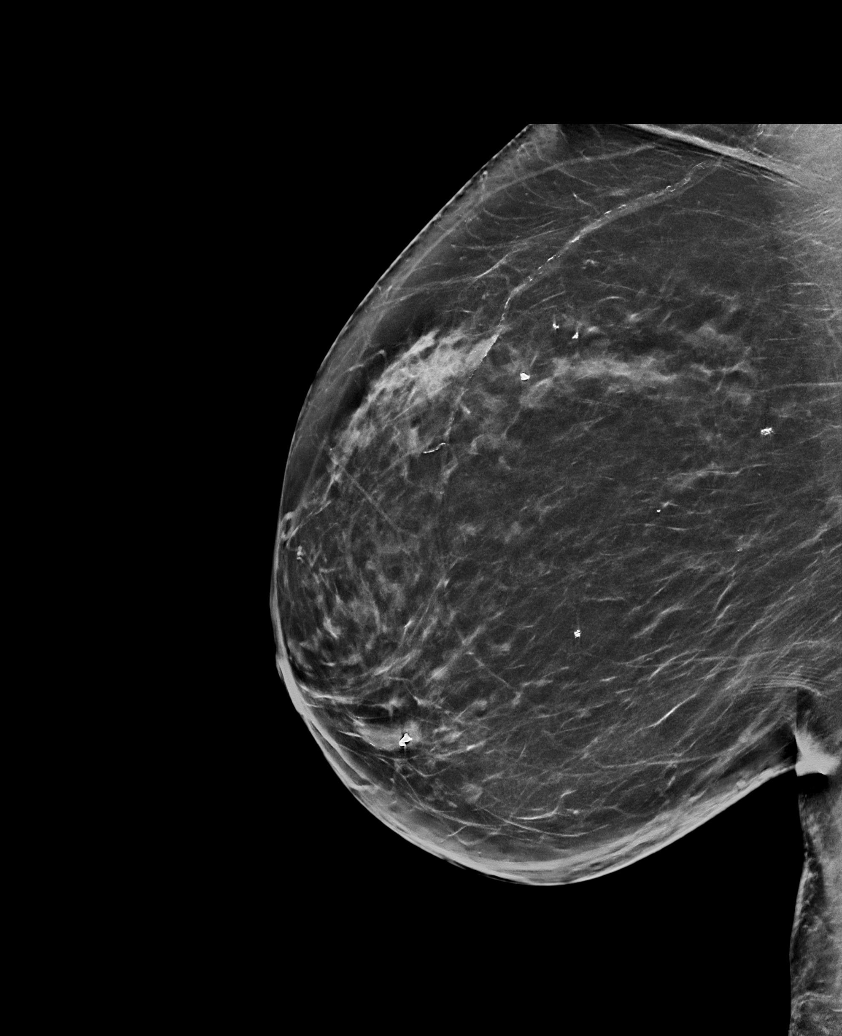

[L XCCL synth-2D]
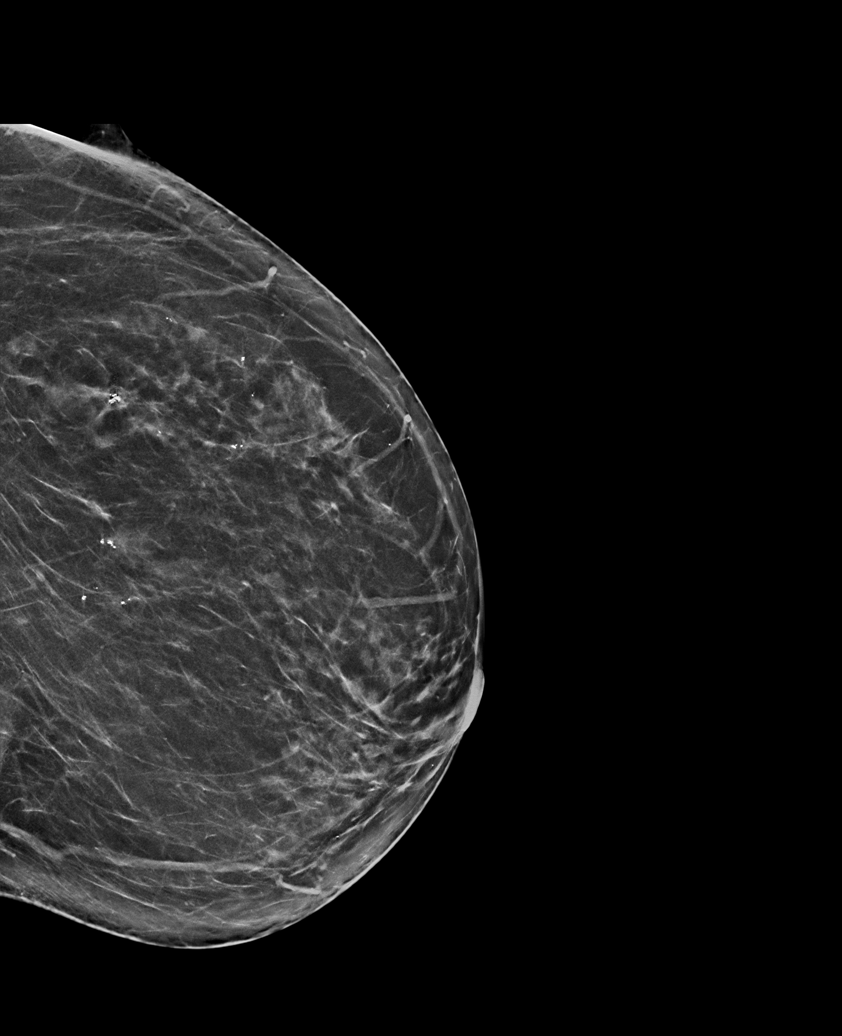

[L CC synth-2D]
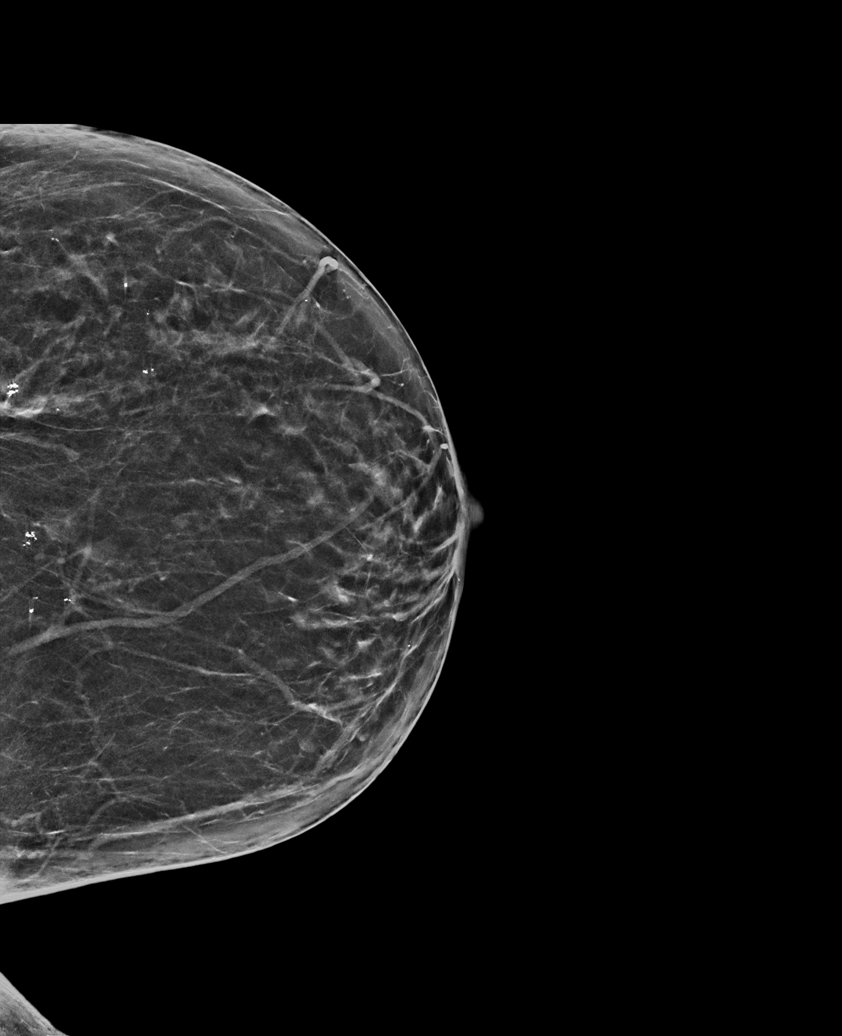

[L XCCL tomo · tomo slice 34/67.0]
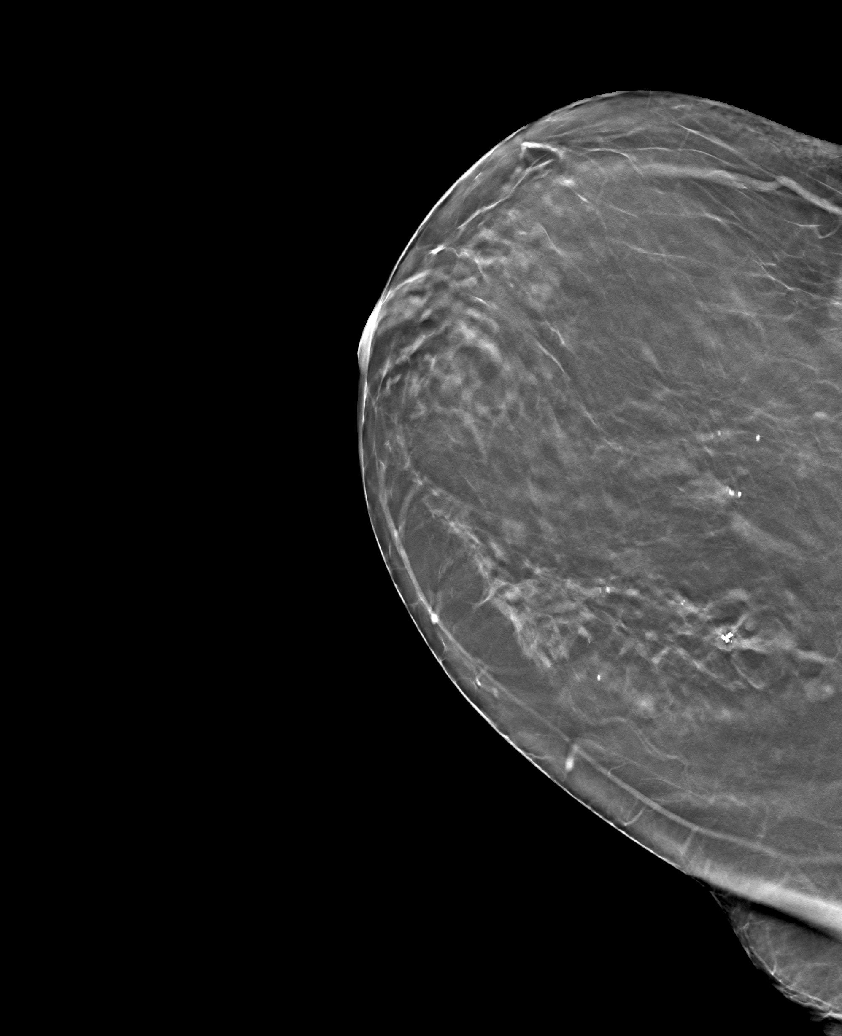

[6 of 30 positions shown; findings below may reference images not displayed]

ACR Breast Density Category b: There are scattered areas of
fibroglandular density.
FINDINGS: There are no findings suspicious for malignancy.
IMPRESSION: No mammographic evidence of malignancy. A result letter of this
screening mammogram will be mailed directly to the patient.

RECOMMENDATION:
Screening mammogram in one year. (Code:51-O-LD2)

BI-RADS CATEGORY  1: Negative.

## 2022-10-14 ENCOUNTER — Encounter: Payer: Self-pay | Admitting: Gastroenterology

## 2022-10-14 ENCOUNTER — Telehealth: Payer: Self-pay | Admitting: Gastroenterology

## 2022-10-14 NOTE — Telephone Encounter (Signed)
Patient called in requesting her instruction and her prep. Patient stated that she called her pharmacy, and they advised her that nothing was sent. Her procedure is on Monday and can you please send her instruction in Spanish.

## 2022-10-19 ENCOUNTER — Encounter
Admission: RE | Disposition: A | Discharge status: Home / Self Care | Payer: Self-pay | Source: Home / Self Care | Attending: Gastroenterology

## 2022-10-19 ENCOUNTER — Ambulatory Visit: Payer: Medicare Other | Admitting: Anesthesiology

## 2022-10-19 ENCOUNTER — Ambulatory Visit
Admission: RE | Admit: 2022-10-19 | Discharge: 2022-10-19 | Disposition: A | Discharge status: Home / Self Care | Payer: Medicare Other | Attending: Gastroenterology | Admitting: Gastroenterology

## 2022-10-19 DIAGNOSIS — D126 Benign neoplasm of colon, unspecified: Secondary | ICD-10-CM

## 2022-10-19 DIAGNOSIS — I10 Essential (primary) hypertension: Secondary | ICD-10-CM | POA: Diagnosis not present

## 2022-10-19 DIAGNOSIS — R195 Other fecal abnormalities: Secondary | ICD-10-CM

## 2022-10-19 DIAGNOSIS — D122 Benign neoplasm of ascending colon: Secondary | ICD-10-CM | POA: Insufficient documentation

## 2022-10-19 DIAGNOSIS — K635 Polyp of colon: Secondary | ICD-10-CM

## 2022-10-19 DIAGNOSIS — E119 Type 2 diabetes mellitus without complications: Secondary | ICD-10-CM | POA: Diagnosis not present

## 2022-10-19 DIAGNOSIS — Z1211 Encounter for screening for malignant neoplasm of colon: Secondary | ICD-10-CM | POA: Diagnosis present

## 2022-10-19 HISTORY — PX: COLONOSCOPY WITH PROPOFOL: SHX5780

## 2022-10-19 HISTORY — PX: POLYPECTOMY: SHX5525

## 2022-10-19 LAB — GLUCOSE, CAPILLARY: Glucose-Capillary: 134 mg/dL — ABNORMAL HIGH (ref 70–99)

## 2022-10-19 SURGERY — COLONOSCOPY WITH PROPOFOL
Anesthesia: General

## 2022-10-19 MED ORDER — SODIUM CHLORIDE 0.9 % IV SOLN
INTRAVENOUS | Status: DC
  Administered 2022-10-19: 20 mL/h via INTRAVENOUS

## 2022-10-19 MED ORDER — PROPOFOL 500 MG/50ML IV EMUL
INTRAVENOUS | Status: DC | PRN
  Administered 2022-10-19: 150 ug/kg/min via INTRAVENOUS

## 2022-10-19 MED ORDER — PROPOFOL 1000 MG/100ML IV EMUL
INTRAVENOUS | Status: AC
  Filled 2022-10-19: qty 100

## 2022-10-19 NOTE — Op Note (Signed)
[facility] Gastroenterology Patient Name: Katherine Walters Procedure Date: 10/19/2022 9:01 AM MRN: [id number] Account #: [id number] Date of Birth: 12-16-1955 Admit Type: Outpatient Age: 67 Room: [facility] ENDO ROOM 1 Gender: Female Note Status: Finalized Instrument Name: Prentice Docker [id number] Procedure:             Colonoscopy Indications:           Screening for colorectal malignant neoplasm Providers:             Wyline Mood MD, MD Referring MD:          No Local Md, MD (Referring MD) Medicines:             Monitored Anesthesia Care Complications:         No immediate complications. Procedure:             Pre-Anesthesia Assessment:                        - Prior to the procedure, a History and Physical was                         performed, and patient medications, allergies and                         sensitivities were reviewed. The patient's tolerance                         of previous anesthesia was reviewed.                        - The risks and benefits of the procedure and the                         sedation options and risks were discussed with the                         patient. All questions were answered and informed                         consent was obtained.                        - ASA Grade Assessment: II - A patient with mild                         systemic disease.                        After obtaining informed consent, the colonoscope was                         passed under direct vision. Throughout the procedure,                         the patient's blood pressure, pulse, and oxygen                         saturations were monitored continuously. The                         Colonoscope  was introduced through the anus and                         advanced to the the cecum, identified by the                         appendiceal orifice. The colonoscopy was performed                         with ease. The patient tolerated the procedure well.                          The quality of the bowel preparation was good. The                         ileocecal valve, appendiceal orifice, and rectum were                         photographed. Findings:      The perianal and digital rectal examinations were normal.      A 6 mm polyp was found in the ascending colon. The polyp was sessile.       The polyp was removed with a cold snare. Resection and retrieval were       complete.      The exam was otherwise without abnormality on direct and retroflexion       views. Impression:            - One 6 mm polyp in the ascending colon, removed with                         a cold snare. Resected and retrieved.                        - The examination was otherwise normal on direct and                         retroflexion views. Recommendation:        - Discharge patient to home (with escort).                        - Resume previous diet.                        - Continue present medications.                        - Await pathology results.                        - Repeat colonoscopy for surveillance based on                         pathology results. Procedure Code(s):     --- Professional ---                        [phone], Colonoscopy, flexible; with removal of                         tumor(s), polyp(s), or other lesion(s)  by snare                         technique Diagnosis Code(s):     --- Professional ---                        Z12.11, Encounter for screening for malignant neoplasm                         of colon                        D12.2, Benign neoplasm of ascending colon CPT copyright 2022 American Medical Association. All rights reserved. The codes documented in this report are preliminary and upon coder review may  be revised to meet current compliance requirements. Wyline Mood, MD Wyline Mood MD, MD 10/19/2022 9:42:33 AM This report has been signed electronically. Number of Addenda: 0 Note Initiated On: 10/19/2022 9:01 AM Scope  Withdrawal Time: 0 hours 8 minutes 4 seconds  Total Procedure Duration: 0 hours 13 minutes 1 second  Estimated Blood Loss:  Estimated blood loss: none.      [facility]

## 2022-10-19 NOTE — Anesthesia Postprocedure Evaluation (Signed)
Anesthesia Post Note  Patient: Katherine Walters  Procedure(s) Performed: COLONOSCOPY WITH PROPOFOL POLYPECTOMY  Patient location during evaluation: Endoscopy Anesthesia Type: General Level of consciousness: awake and alert Pain management: pain level controlled Vital Signs Assessment: post-procedure vital signs reviewed and stable Respiratory status: spontaneous breathing, nonlabored ventilation and respiratory function stable Cardiovascular status: blood pressure returned to baseline and stable Postop Assessment: no apparent nausea or vomiting Anesthetic complications: no   No notable events documented.   Last Vitals:  Vitals:   10/19/22 0954 10/19/22 1004  BP: (!) 108/58 122/68  Pulse: 71 62  Resp: 18 13  Temp:    SpO2: 95% 96%    Last Pain:  Vitals:   10/19/22 1004  TempSrc:   PainSc: 0-No pain                 Foye Deer

## 2022-10-19 NOTE — Transfer of Care (Signed)
Immediate Anesthesia Transfer of Care Note  Patient: Katherine Walters  Procedure(s) Performed: COLONOSCOPY WITH PROPOFOL POLYPECTOMY  Patient Location: PACU  Anesthesia Type:General  Level of Consciousness: awake, alert , and oriented  Airway & Oxygen Therapy: Patient Spontanous Breathing and Patient connected to nasal cannula oxygen  Post-op Assessment: Report given to RN and Post -op Vital signs reviewed and stable  Post vital signs: Reviewed and stable  Last Vitals:  Vitals Value Taken Time  BP 101/55 10/19/22 0945  Temp    Pulse 72 10/19/22 0945  Resp 19 10/19/22 0945  SpO2 97 % 10/19/22 0945  Vitals shown include unfiled device data.  Last Pain:  Vitals:   10/19/22 0904  TempSrc: Temporal  PainSc: 3          Complications: No notable events documented.

## 2022-10-19 NOTE — H&P (Signed)
Wyline Mood, MD [address], [place], [place], [address] [address], [address], [place], [place], [address] Phone: [phone]  Fax: [phone]  Primary Care Physician:  Marya Fossa, PA-C   Pre-Procedure History & Physical: HPI:  Katherine Walters is a 67 y.o. female is here for an colonoscopy.   No past medical history on file.  Past Surgical History:  Procedure Laterality Date   ABDOMINAL HYSTERECTOMY      Prior to Admission medications   Medication Sig Start Date End Date Taking? Authorizing Provider  alendronate (FOSAMAX) 70 MG tablet Take 1 tablet (70 mg total) by mouth every 7 (seven) days. Take with a full glass of water on an empty stomach. 08/21/22   Shamleffer, Konrad Dolores, MD  aspirin 81 MG chewable tablet Chew by mouth.    [provider]  cetirizine (ZYRTEC) 10 MG tablet Take by mouth. 05/01/20   [provider]  Cholecalciferol (VITAMIN D3) 50 MCG (2000 UT) CAPS Take 1 capsule (2,000 Units total) by mouth daily. 06/11/22   Shamleffer, Konrad Dolores, MD  JARDIANCE 25 MG TABS tablet Take 25 mg by mouth daily. 08/03/22   [provider]  LANTUS SOLOSTAR 100 UNIT/ML Solostar Pen Inject into the skin. 08/03/22   [provider]  liraglutide (VICTOZA) 18 MG/3ML SOPN Inject 1.2 mg into the skin daily.    [provider]  lisinopril-hydrochlorothiazide (ZESTORETIC) 20-25 MG tablet TAKE 1 TABLET BY MOUTH ONCE DAILY NEEDS  FOLLOW  UP  FOR  FURTHER  REFILLS 01/11/18   [provider]  methimazole (TAPAZOLE) 10 MG tablet Take 1.5 tablets (15 mg total) by mouth daily. 08/24/22   Shamleffer, Konrad Dolores, MD  terbinafine (LAMISIL) 250 MG tablet Take 250 mg by mouth daily. 06/18/20   [provider]  triamcinolone cream (KENALOG) 0.1 % SMARTSIG:1 Sparingly Topical Twice Daily 04/11/20   [provider]    Allergies as of 08/24/2022 - Review Complete 08/21/2022  Allergen Reaction Noted    Iodine Other (See Comments) 08/21/2013    Family History  Problem Relation Age of Onset   Breast cancer Neg Hx     Social History   Socioeconomic History   Marital status: Married    Spouse name: Not on file   Number of children: Not on file   Years of education: Not on file   Highest education level: Not on file  Occupational History   Not on file  Tobacco Use   Smoking status: Never   Smokeless tobacco: Never  Substance and Sexual Activity   Alcohol use: Not on file   Drug use: Not on file   Sexual activity: Not on file  Other Topics Concern   Not on file  Social History Narrative   Not on file   Social Determinants of Health   Financial Resource Strain: Not on file  Food Insecurity: Not on file  Transportation Needs: Not on file  Physical Activity: Not on file  Stress: Not on file  Social Connections: Not on file  Intimate Partner Violence: Not on file    Review of Systems: See HPI, otherwise negative ROS  Physical Exam: There were no vitals taken for this visit. General:   Alert,  pleasant and cooperative in NAD Head:  Normocephalic and atraumatic. Neck:  Supple; no masses or thyromegaly. Lungs:  Clear throughout to auscultation, normal respiratory effort.    Heart:  +S1, +S2, Regular rate and rhythm, No edema. Abdomen:  Soft, nontender and nondistended. Normal bowel sounds, without guarding, and without rebound.   Neurologic:  Alert and  oriented x4;  grossly normal neurologically.  Impression/Plan: Katherine Walters is here for an colonoscopy to be performed for heme positive stool Risks, benefits, limitations, and alternatives regarding  colonoscopy have been reviewed with the patient.  Questions have been answered.  All parties agreeable.   Wyline Mood, MD  10/19/2022, 8:39 AM

## 2022-10-19 NOTE — Anesthesia Preprocedure Evaluation (Addendum)
Anesthesia Evaluation  Patient identified by MRN, date of birth, ID band Patient awake    Reviewed: Allergy & Precautions, H&P , NPO status , Patient's Chart, lab work & pertinent test results  Airway Mallampati: III  TM Distance: >3 FB Neck ROM: full    Dental  (+) Partial Upper   Pulmonary neg pulmonary ROS   Pulmonary exam normal        Cardiovascular hypertension, Normal cardiovascular exam     Neuro/Psych negative neurological ROS  negative psych ROS   GI/Hepatic negative GI ROS, Neg liver ROS,,,  Endo/Other  diabetes Hyperthyroidism   Renal/GU negative Renal ROS  negative genitourinary   Musculoskeletal   Abdominal  (+) + obese  Peds  Hematology negative hematology ROS (+)   Anesthesia Other Findings Past Surgical History: No date: ABDOMINAL HYSTERECTOMY     Reproductive/Obstetrics negative OB ROS                             Anesthesia Physical Anesthesia Plan  ASA: 2  Anesthesia Plan: General   Post-op Pain Management:    Induction: Intravenous  PONV Risk Score and Plan: Propofol infusion and TIVA  Airway Management Planned: Natural Airway  Additional Equipment:   Intra-op Plan:   Post-operative Plan:   Informed Consent: I have reviewed the patients History and Physical, chart, labs and discussed the procedure including the risks, benefits and alternatives for the proposed anesthesia with the patient or authorized representative who has indicated his/her understanding and acceptance.     Dental Advisory Given and Interpreter used for interveiw  Plan Discussed with: CRNA and Surgeon  Anesthesia Plan Comments:         Anesthesia Quick Evaluation

## 2022-10-20 ENCOUNTER — Encounter: Payer: Self-pay | Admitting: Gastroenterology

## 2022-10-22 ENCOUNTER — Encounter: Payer: Self-pay | Admitting: Gastroenterology

## 2023-02-22 ENCOUNTER — Encounter: Payer: Self-pay | Admitting: Internal Medicine

## 2023-02-22 ENCOUNTER — Ambulatory Visit: Payer: Medicare Other | Admitting: Internal Medicine

## 2023-02-22 VITALS — BP 124/80 | HR 83 | Ht 62.21 in | Wt 204.0 lb

## 2023-02-22 DIAGNOSIS — E042 Nontoxic multinodular goiter: Secondary | ICD-10-CM | POA: Diagnosis not present

## 2023-02-22 DIAGNOSIS — M810 Age-related osteoporosis without current pathological fracture: Secondary | ICD-10-CM | POA: Diagnosis not present

## 2023-02-22 DIAGNOSIS — R748 Abnormal levels of other serum enzymes: Secondary | ICD-10-CM | POA: Diagnosis not present

## 2023-02-22 DIAGNOSIS — E0590 Thyrotoxicosis, unspecified without thyrotoxic crisis or storm: Secondary | ICD-10-CM

## 2023-02-22 NOTE — Progress Notes (Unsigned)
Name: Katherine Walters  MRN/ DOB: [id number], 1956-03-04    Age/ Sex: 67 y.o., female    PCP: Marya Fossa, Cordelia Poche   Reason for Endocrinology Evaluation: Osteoporosis     Date of Initial Endocrinology Evaluation: 06/10/2022    HPI: Katherine Walters is a 67 y.o. female with a past medical history of HTN, DM, MNG, GERD. The patient presented for initial endocrinology clinic visit on 06/10/2022 for consultative assistance with her osteoporosis.     Pt was diagnosed with osteoporosis: 08/2022  Menarche at age : 49 Menopausal at age : hysterectomy at age 38 Fracture Hx: Right patellar fracture after a slip injury  Hx of HRT: no FH of osteoporosis or hip fracture: no Prior Hx of anti-estrogenic therapy :no Prior Hx of anti-resorptive therapy : Alendronate 08/2021 but stopped it due GERD   She takes calcium incorporated in her lipid lowering agent Denies taking extra calcium or vitamin D  She is not on MVI     THYROID HISTORY: Of note, the patient has been diagnosed with multinodular goiter based on ultrasound 10/2012.  The largest hypoechoic nodule in the left inferior thyroid pole 2.5 x 2.9 x 4 cm .  She underwent thyroid uptake and scan 07/2013 with large hyperfunctioning nodule within the left lobe of the thyroid gland.  With suppression of the right lobe of the thyroid gland.   She has been on methimazole since 2021    She is s/p FNA of the left isthmic 3.4 cm and left inferior 4.7 cm nodules/2024 with atypia of undetermined significance (Bethesda category III) , Afirma benign for both      Denies Fh of thyroid disease     SUBJECTIVE:    Today (02/22/23):  Katherine Walters is here for follow-up on osteoporosis and multinodular goiter.    Denies local neck swelling  Continues with occasional palpitations Denies any changes in bowel movements She has noted mild heartburn symptoms  She received calcium through the food  Denies loose stools or  diarrhea    Methimazole 10 mg, 1.5 tabs  daily Alendronate 70 mg weekly Vitamin D3 4000 IU daily     HISTORY:  Past Medical History: No past medical history on file. Past Surgical History:  Past Surgical History:  Procedure Laterality Date   ABDOMINAL HYSTERECTOMY     COLONOSCOPY WITH PROPOFOL N/A 10/19/2022   Procedure: COLONOSCOPY WITH PROPOFOL;  Surgeon: Wyline Mood, MD;  Location: [facility] ENDOSCOPY;  Service: Gastroenterology;  Laterality: N/A;  Spanish interpreter   POLYPECTOMY  10/19/2022   Procedure: POLYPECTOMY;  Surgeon: Wyline Mood, MD;  Location: [facility] ENDOSCOPY;  Service: Gastroenterology;;    Social History:  reports that she has never smoked. She has never used smokeless tobacco. Family History: family history is not on file.   HOME MEDICATIONS: Allergies as of 02/22/2023       Reactions   Iodine Other (See Comments)        Medication List        Accurate as of February 22, 2023 10:01 AM. If you have any questions, ask your nurse or doctor.          alendronate 70 MG tablet Commonly known as: FOSAMAX Take 1 tablet (70 mg total) by mouth every 7 (seven) days. Take with a full glass of water on an empty stomach.   aspirin 81 MG chewable tablet Chew by mouth.   atorvastatin 80 MG tablet Commonly known as: LIPITOR Take 80  mg by mouth daily.   cetirizine 10 MG tablet Commonly known as: ZYRTEC Take by mouth.   chlorhexidine 0.12 % solution Commonly known as: PERIDEX   Jardiance 25 MG Tabs tablet Generic drug: empagliflozin Take 25 mg by mouth daily.   Lantus SoloStar 100 UNIT/ML Solostar Pen Generic drug: insulin glargine Inject into the skin.   lisinopril-hydrochlorothiazide 20-25 MG tablet Commonly known as: ZESTORETIC TAKE 1 TABLET BY MOUTH ONCE DAILY NEEDS  FOLLOW  UP  FOR  FURTHER  REFILLS   lisinopril-hydrochlorothiazide 20-12.5 MG tablet Commonly known as: ZESTORETIC Take 1 tablet by mouth 2 (two) times daily.   meloxicam 7.5 MG  tablet Commonly known as: MOBIC Take 7.5 mg by mouth daily.   methimazole 10 MG tablet Commonly known as: TAPAZOLE Take 1.5 tablets (15 mg total) by mouth daily.   rosuvastatin 40 MG tablet Commonly known as: CRESTOR Take 40 mg by mouth daily.   terbinafine 250 MG tablet Commonly known as: LAMISIL Take 250 mg by mouth daily.   triamcinolone cream 0.1 % Commonly known as: KENALOG SMARTSIG:1 Sparingly Topical Twice Daily   TRUEplus 5-Bevel Pen Needles 31G X 6 MM Misc Generic drug: Insulin Pen Needle   Trulicity 0.75 MG/0.5ML Soaj Generic drug: Dulaglutide Inject into the skin.   Trulicity 1.5 MG/0.5ML Soaj Generic drug: Dulaglutide Inject into the skin.   Trulicity 3 MG/0.5ML Soaj Generic drug: Dulaglutide   Victoza 18 MG/3ML Sopn Generic drug: liraglutide Inject 1.2 mg into the skin daily.   Vitamin D3 50 MCG (2000 UT) capsule Take 1 capsule (2,000 Units total) by mouth daily.          REVIEW OF SYSTEMS: A comprehensive ROS was conducted with the patient and is negative except as per HPI    OBJECTIVE:  VS: BP 124/80 (BP Location: Left Arm, Patient Position: Sitting, Cuff Size: Large)   Pulse 83   Ht 5' 2.21" (1.58 m)   Wt 204 lb (92.5 kg)   SpO2 99%   BMI 37.06 kg/m    Wt Readings from Last 3 Encounters:  02/22/23 204 lb (92.5 kg)  10/19/22 197 lb 9.6 oz (89.6 kg)  08/21/22 199 lb 6.4 oz (90.4 kg)     EXAM: General: Pt appears well and is in NAD  Eyes: External eye exam normal without stare, lid lag or exophthalmos.  EOM in  Neck: General: Supple without adenopathy. Thyroid: Unable to appreciate any  goiter. No thyroid bruit.  Lungs: Clear with good BS bilat   Heart: Auscultation: RRR.  Abdomen:  soft, nontender  Extremities:  BL LE: No pretibial edema normal  Mental Status: Judgment, insight: Intact Orientation: Oriented to time, place, and person Mood and affect: No depression, anxiety, or agitation     DATA REVIEWED:   ****     DXA 08/14/2021 @[facility] [phone] AP spine -2.2 Left femoral neck -2.5 Left total hip -0.8    Thyroid ultrasound 06/22/2022 FINDINGS: Parenchymal Echotexture: Moderately heterogenous   Isthmus: 0.2 cm   Right lobe: 4.2 x 1.2 x 1.4 cm   Left lobe: 6.0 x 3.1 x 3.0 cm   _________________________________________________________   Estimated total number of nodules >/= 1 cm: 4   Number of spongiform nodules >/=  2 cm not described below (TR1): 0   Number of mixed cystic and solid nodules >/= 1.5 cm not described below (TR2): 0   _________________________________________________________   Nodule # 1:   Prior biopsy: No   Location: Isthmus; Mid, left  Maximum size: 3.4 cm; Other 2 dimensions: 1.3 x 2.8 cm, previously, 0.7 x 0.4 x 0.8 cm   Composition: solid/almost completely solid (2)   Echogenicity: hypoechoic (2)   Shape: not taller-than-wide (0)   Margins: smooth (0)   Echogenic foci: none (0)   ACR TI-RADS total points: 4.   ACR TI-RADS risk category:  TR4 (4-6 points).   Significant change in size (>/= 20% in two dimensions and minimal increase of 2 mm): Yes   Change in features: No   Change in ACR TI-RADS risk category: No   ACR TI-RADS recommendations:   **Given size (>/= 1.5 cm) and appearance, fine needle aspiration of this moderately suspicious nodule should be considered based on TI-RADS criteria.   _________________________________________________________   Nodule #2 is a hypoechoic nodule along the right side of the isthmus that measures 0.8 x 0.2 x 0.5 cm. This is a TR 4 nodule. Given size (<0.9 cm) and appearance, this nodule does NOT meet TI-RADS criteria for biopsy or dedicated follow-up.   Nodule #3 is a cystic nodule in the superior right thyroid lobe that measures up to 0.8 cm and does not require dedicated follow-up.   Nodule #4 is a mixed cystic and solid nodule in the mid right thyroid lobe that measures 1.2  x 0.7 x 0.8 cm. Solid component is isoechoic. This nodule does not require dedicated follow-up.   Nodule # 5:   Prior biopsy: No   Location: Left; mid/superior   Maximum size: 1.5 cm; Other 2 dimensions: 1.4 x 0.8 cm, previously, 1.3 x 0.9 x 1.4 cm   Composition: solid/almost completely solid (2)   Echogenicity: hypoechoic (2)   Shape: not taller-than-wide (0)   Margins: smooth (0)   Echogenic foci: macrocalcifications (1)   ACR TI-RADS total points: 5.   ACR TI-RADS risk category:  TR4 (4-6 points).   Significant change in size (>/= 20% in two dimensions and minimal increase of 2 mm): No   Change in features: No   Change in ACR TI-RADS risk category: No   ACR TI-RADS recommendations:   Nodule has minimally changed since 2014 and most compatible with a benign nodule.   _________________________________________________________   Nodule # 6:   Prior biopsy: No   Location: Left; Inferior   Maximum size: 4.7 cm; Other 2 dimensions: 2.1 x 3.4 cm, previously, 4.0 x 2.9 x 2.5 cm   Composition: solid/almost completely solid (2)   Echogenicity: hypoechoic (2)   Shape: not taller-than-wide (0)   Margins: smooth (0)   Echogenic foci: macrocalcifications (1)   ACR TI-RADS total points: 5.   ACR TI-RADS risk category:  TR4 (4-6 points).   Significant change in size (>/= 20% in two dimensions and minimal increase of 2 mm): Yes   Change in features: No   Change in ACR TI-RADS risk category: No   ACR TI-RADS recommendations:   **Given size (>/= 1.5 cm) and appearance, fine needle aspiration of this moderately suspicious nodule should be considered based on TI-RADS criteria.   _________________________________________________________   IMPRESSION: 1. Multinodular goiter. 2. Nodule #1 along the left side of the isthmus has significantly enlarged since 2014. This is a TR 4 nodule and recommend ultrasound-guided fine-needle aspiration of this nodule. 3.  Nodule #6 in the inferior left thyroid lobe has slightly enlarged since 2014. Consider ultrasound-guided biopsy of this nodule. 4. Nodule #5 in the left mid thyroid is a TR 4 nodule but has not significantly changed since 2014. This is  most likely a benign nodule.    FNA Left  isthmic 3.4 cm nodule 07/07/2022  Clinical History: Isthmus; Mid, Left 3.4cm;  Other 2 dimensions: 1.3 x  2.8cm, previously 0.7 x 0.4 x 0.8cm, solid/almost completely solid  hypoechoic TI-RADS - 4  Specimen Submitted:  A. 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 THYROID, LEFT INFERIOR, FINE NEEDLE ASPIRATION:    FINAL MICROSCOPIC DIAGNOSIS:  - Atypia of undetermined significance (Bethesda category III)    Afirma Benign   ASSESSMENT/PLAN/RECOMMENDATIONS:   Osteoporosis  -We emphasized the importance of optimizing calcium and vitamin D - she was started on alendronate through her PCP in 2023, she initially did not recall being on any medications for osteoporosis but she eventually stated that she did develop GI side effects -During her initial visit with me we discussed zoledronic acid versus Prolia, but she opted with Actonel, she eventually opted to try alendronate again due to the cost of Actonel -BMP, and vitamin D normal  Medications : Calcium 1200 mg daily Alendronate 70 mg weekly    2.Hyperthyroidism  -Per patient, she has been on methimazole for a few years -TSH normalized, but free T4 is low, will decrease methimazole as below    Medication Decrease methimazole 10 mg, 1.5 tab  daily  3. MNG:  - NO locak  neck symptoms  -She was diagnosed with multinodular goiter in 2014, and uptake and scan showed a toxic left thyroid nodule -She is s/p isthmic 3.4 cm and left inferior 4.7 cm nodules/2024 with atypia of undetermined significance (Bethesda category IV) , Afirma was benign for both     4.  Vitamin D insufficiency:  -Resolved  Continue OTC vitamin D3 4000 IU daily  5. Elevated Alkaline Phosphatase:    Follow-up in 6 months   Signed electronically by: Lyndle Herrlich, MD  [facility]ocr[facility]  [facility] [address] [place] [place], [place] [address] Phone: [phone] FAX: [phone]   CC: Cyndi Bender [address] [place] [address] Phone: [phone] Fax: [phone]   Return to Endocrinology clinic as below: No future appointments.

## 2023-02-23 LAB — COMPREHENSIVE METABOLIC PANEL
AG Ratio: 1.5 (calc) (ref 1.0–2.5)
ALT: 15 U/L (ref 6–29)
AST: 13 U/L (ref 10–35)
Albumin: 4.1 g/dL (ref 3.6–5.1)
Alkaline phosphatase (APISO): 100 U/L (ref 37–153)
BUN: 21 mg/dL (ref 7–25)
CO2: 29 mmol/L (ref 20–32)
Calcium: 9.9 mg/dL (ref 8.6–10.4)
Chloride: 101 mmol/L (ref 98–110)
Creat: 0.61 mg/dL (ref 0.50–1.05)
Globulin: 2.7 g/dL (ref 1.9–3.7)
Glucose, Bld: 140 mg/dL — ABNORMAL HIGH (ref 65–99)
Potassium: 4.1 mmol/L (ref 3.5–5.3)
Sodium: 140 mmol/L (ref 135–146)
Total Bilirubin: 0.4 mg/dL (ref 0.2–1.2)
Total Protein: 6.8 g/dL (ref 6.1–8.1)

## 2023-02-23 LAB — VITAMIN D 25 HYDROXY (VIT D DEFICIENCY, FRACTURES): Vit D, 25-Hydroxy: 38 ng/mL (ref 30–100)

## 2023-02-23 LAB — TSH: TSH: 0.02 m[IU]/L — ABNORMAL LOW (ref 0.40–4.50)

## 2023-02-23 LAB — T4, FREE: Free T4: 1.6 ng/dL (ref 0.8–1.8)

## 2023-02-24 ENCOUNTER — Telehealth: Payer: Self-pay | Admitting: Internal Medicine

## 2023-02-24 MED ORDER — METHIMAZOLE 10 MG PO TABS: 15.0000 mg | ORAL_TABLET | ORAL | 2 refills | Status: DC

## 2023-02-24 NOTE — Telephone Encounter (Signed)
Please let the patient know that her thyroid is overactive again, please asked the patient to change methimazole as below   Take TWO tablets of methimazole on Saturdays and Sundays from now on, but continue to take 1.5 tablets Monday through Friday  Let her know that her kidney function and vitamin D are normal and to continue with vitamin D and calcium  Also let her know that her alkaline phosphatase has normalized, this means her bones are doing better with the Fosamax  Thanks

## 2023-02-24 NOTE — Telephone Encounter (Signed)
LMTCB

## 2023-02-25 NOTE — Telephone Encounter (Signed)
LMTCB

## 2023-08-23 ENCOUNTER — Ambulatory Visit: Payer: Medicare Other | Admitting: Internal Medicine

## 2023-08-23 ENCOUNTER — Telehealth: Payer: Self-pay

## 2023-08-23 ENCOUNTER — Encounter: Payer: Self-pay | Admitting: Internal Medicine

## 2023-08-23 VITALS — BP 120/80 | HR 71 | Ht 62.0 in | Wt 201.0 lb

## 2023-08-23 DIAGNOSIS — E0590 Thyrotoxicosis, unspecified without thyrotoxic crisis or storm: Secondary | ICD-10-CM | POA: Diagnosis not present

## 2023-08-23 DIAGNOSIS — M810 Age-related osteoporosis without current pathological fracture: Secondary | ICD-10-CM | POA: Diagnosis not present

## 2023-08-23 DIAGNOSIS — E042 Nontoxic multinodular goiter: Secondary | ICD-10-CM | POA: Diagnosis not present

## 2023-08-23 NOTE — Telephone Encounter (Signed)
 Dr. Rosalea Collin, patient will be scheduled as soon as possible.  Auth Submission: NO AUTH NEEDED Site of care: Site of care: [facility] Payer: [employer] medicare Medication & CPT/J Code(s) submitted: Reclast (Zolendronic acid) [id number] Route of submission (phone, fax, portal):  Phone # Fax # Auth type: Buy/Bill PB Units/visits requested: 5mg  x 1 dose Reference number:  Approval from: 08/23/23 to 03/15/24

## 2023-08-23 NOTE — Progress Notes (Unsigned)
 Name: Katherine Walters  MRN/ DOB: [id number], May 09, 1955    Age/ Sex: 68 y.o., female    PCP: Deborah Falling, Kirby Peoples   Reason for Endocrinology Evaluation: Osteoporosis     Date of Initial Endocrinology Evaluation: 06/10/2022    HPI: Katherine Walters is a 68 y.o. female with a past medical history of HTN, DM, MNG, GERD. The patient presented for initial endocrinology clinic visit on 06/10/2022 for consultative assistance with her osteoporosis.     Pt was diagnosed with osteoporosis: 08/2022  Menarche at age : 77 Menopausal at age : hysterectomy at age 45 Fracture Hx: Right patellar fracture after a slip injury  Hx of HRT: no FH of osteoporosis or hip fracture: no Prior Hx of anti-estrogenic therapy :no Prior Hx of anti-resorptive therapy : Alendronate  08/2021 but stopped it due GERD .  On her initial visit to our clinic 05/2022 I prescribed to donate, but the patient requested to change this to alendronate  due to cost.  She has been on alendronate  since 08/21/2022, but she discontinued by 06/2023 due to GERD   THYROID  HISTORY: Of note, the patient has been diagnosed with multinodular goiter based on ultrasound 10/2012.  The largest hypoechoic nodule in the left inferior thyroid  pole 2.5 x 2.9 x 4 cm .  She underwent thyroid  uptake and scan 07/2013 with large hyperfunctioning nodule within the left lobe of the thyroid  gland.  With suppression of the right lobe of the thyroid  gland.   She has been on methimazole  since 2021    She is s/p FNA of the left isthmic 3.4 cm and left inferior 4.7 cm nodules/2024 with atypia of undetermined significance (Bethesda category III) , Afirma benign for both      Denies Fh of thyroid  disease     SUBJECTIVE:    Today (08/23/23):  Katherine Walters is here for follow-up on osteoporosis and multinodular goiter.  Interpreter line used  Emmanual # [id number]  She stopped Alendronate  due to heart burn in 06/2023  Denies constipation or  diarrhea  Denies local neck swelling  Continues with palpitations    She continues to consume calcium through the food  Methimazole  10 mg, 1.5 tabs Monday through Friday, 2 tablets on Saturdays and Sundays- she takes 2 on Saturdays and 1.5 tabs rest of the week  Alendronate  70 mg weekly- not taking  Vitamin D3 4000 IU daily     HISTORY:  Past Medical History: No past medical history on file. Past Surgical History:  Past Surgical History:  Procedure Laterality Date   ABDOMINAL HYSTERECTOMY     COLONOSCOPY WITH PROPOFOL  N/A 10/19/2022   Procedure: COLONOSCOPY WITH PROPOFOL ;  Surgeon: Luke Salaam, MD;  Location: [facility] ENDOSCOPY;  Service: Gastroenterology;  Laterality: N/A;  Spanish interpreter   POLYPECTOMY  10/19/2022   Procedure: POLYPECTOMY;  Surgeon: Luke Salaam, MD;  Location: [facility] ENDOSCOPY;  Service: Gastroenterology;;    Social History:  reports that she has never smoked. She has never used smokeless tobacco. Family History: family history is not on file.   HOME MEDICATIONS: Allergies as of 08/23/2023       Reactions   Iodine Other (See Comments)        Medication List        Accurate as of August 23, 2023 10:55 AM. If you have any questions, ask your nurse or doctor.          alendronate  70 MG tablet Commonly known as: FOSAMAX  Take 1  tablet (70 mg total) by mouth every 7 (seven) days. Take with a full glass of water on an empty stomach.   aspirin 81 MG chewable tablet Chew by mouth.   atorvastatin 80 MG tablet Commonly known as: LIPITOR Take 80 mg by mouth daily.   cetirizine 10 MG tablet Commonly known as: ZYRTEC Take by mouth.   chlorhexidine 0.12 % solution Commonly known as: PERIDEX   FreeStyle Libre 2 Sensor Misc   Jardiance 25 MG Tabs tablet Generic drug: empagliflozin Take 25 mg by mouth daily.   Lantus SoloStar 100 UNIT/ML Solostar Pen Generic drug: insulin glargine Inject into the skin.   lisinopril-hydrochlorothiazide 20-25 MG  tablet Commonly known as: ZESTORETIC TAKE 1 TABLET BY MOUTH ONCE DAILY NEEDS  FOLLOW  UP  FOR  FURTHER  REFILLS   lisinopril-hydrochlorothiazide 20-12.5 MG tablet Commonly known as: ZESTORETIC Take 1 tablet by mouth 2 (two) times daily.   meloxicam 7.5 MG tablet Commonly known as: MOBIC Take 7.5 mg by mouth daily.   methimazole  10 MG tablet Commonly known as: TAPAZOLE  Take 1.5 tablets (15 mg total) by mouth as directed. 1.5 tablets Monday through Friday, 2 tablets on Saturday and Sunday   methocarbamol 500 MG tablet Commonly known as: ROBAXIN Take 500 mg by mouth 4 (four) times daily as needed.   rosuvastatin 40 MG tablet Commonly known as: CRESTOR Take 40 mg by mouth daily.   terbinafine 250 MG tablet Commonly known as: LAMISIL Take 250 mg by mouth daily.   triamcinolone cream 0.1 % Commonly known as: KENALOG SMARTSIG:1 Sparingly Topical Twice Daily   TRUEplus 5-Bevel Pen Needles 31G X 6 MM Misc Generic drug: Insulin Pen Needle   Trulicity 0.75 MG/0.5ML Soaj Generic drug: Dulaglutide Inject into the skin.   Trulicity 1.5 MG/0.5ML Soaj Generic drug: Dulaglutide Inject into the skin.   Trulicity 3 MG/0.5ML Soaj Generic drug: Dulaglutide   Victoza 18 MG/3ML Sopn Generic drug: liraglutide Inject 1.2 mg into the skin daily.   Vitamin D3 50 MCG (2000 UT) capsule Take 1 capsule (2,000 Units total) by mouth daily.          REVIEW OF SYSTEMS: A comprehensive ROS was conducted with the patient and is negative except as per HPI    OBJECTIVE:  VS: BP 120/80 (BP Location: Left Arm, Patient Position: Sitting, Cuff Size: Normal)   Pulse 71   Ht 5\' 2"  (1.575 m)   Wt 201 lb (91.2 kg)   SpO2 98%   BMI 36.76 kg/m    Wt Readings from Last 3 Encounters:  08/23/23 201 lb (91.2 kg)  02/22/23 204 lb (92.5 kg)  10/19/22 197 lb 9.6 oz (89.6 kg)     EXAM: General: Pt appears well and is in NAD  Eyes: External eye exam normal without stare, lid lag or  exophthalmos.  EOM in  Neck: General: Supple without adenopathy. Thyroid : Unable to appreciate any  goiter. No thyroid  bruit.  Lungs: Clear with good BS bilat   Heart: Auscultation: RRR.  Abdomen:  soft, nontender  Extremities:  BL LE: No pretibial edema normal  Mental Status: Judgment, insight: Intact Orientation: Oriented to time, place, and person Mood and affect: No depression, anxiety, or agitation     DATA REVIEWED:  Latest Reference Range & Units 02/22/23 10:11  Sodium 135 - 146 mmol/L 140  Potassium 3.5 - 5.3 mmol/L 4.1  Chloride 98 - 110 mmol/L 101  CO2 20 - 32 mmol/L 29  Glucose 65 - 99 mg/dL 161 (H)  BUN 7 - 25 mg/dL 21  Creatinine 0.98 - 1.19 mg/dL 1.47  Calcium 8.6 - 82.9 mg/dL 9.9  BUN/Creatinine Ratio 6 - 22 (calc) SEE NOTE:  AG Ratio 1.0 - 2.5 (calc) 1.5  AST 10 - 35 U/L 13  ALT 6 - 29 U/L 15  Total Protein 6.1 - 8.1 g/dL 6.8  Total Bilirubin 0.2 - 1.2 mg/dL 0.4  Alkaline phosphatase (APISO) 37 - 153 U/L 100  Vitamin D , 25-Hydroxy 30 - 100 ng/mL 38  Globulin 1.9 - 3.7 g/dL (calc) 2.7  TSH 5.62 - 4.50 mIU/L 0.02 (L)  T4,Free(Direct) 0.8 - 1.8 ng/dL 1.6  Albumin MSPROF 3.6 - 5.1 g/dL 4.1      DXA 03/19/863 @[facility] [phone] AP spine -2.2 Left femoral neck -2.5 Left total hip -0.8    Thyroid  ultrasound 06/22/2022 FINDINGS: Parenchymal Echotexture: Moderately heterogenous   Isthmus: 0.2 cm   Right lobe: 4.2 x 1.2 x 1.4 cm   Left lobe: 6.0 x 3.1 x 3.0 cm   _________________________________________________________   Estimated total number of nodules >/= 1 cm: 4   Number of spongiform nodules >/=  2 cm not described below (TR1): 0   Number of mixed cystic and solid nodules >/= 1.5 cm not described below (TR2): 0   _________________________________________________________   Nodule # 1:   Prior biopsy: No   Location: Isthmus; Mid, left   Maximum size: 3.4 cm; Other 2 dimensions: 1.3 x 2.8 cm, previously, 0.7 x 0.4 x  0.8 cm   Composition: solid/almost completely solid (2)   Echogenicity: hypoechoic (2)   Shape: not taller-than-wide (0)   Margins: smooth (0)   Echogenic foci: none (0)   ACR TI-RADS total points: 4.   ACR TI-RADS risk category:  TR4 (4-6 points).   Significant change in size (>/= 20% in two dimensions and minimal increase of 2 mm): Yes   Change in features: No   Change in ACR TI-RADS risk category: No   ACR TI-RADS recommendations:   **Given size (>/= 1.5 cm) and appearance, fine needle aspiration of this moderately suspicious nodule should be considered based on TI-RADS criteria.   _________________________________________________________   Nodule #2 is a hypoechoic nodule along the right side of the isthmus that measures 0.8 x 0.2 x 0.5 cm. This is a TR 4 nodule. Given size (<0.9 cm) and appearance, this nodule does NOT meet TI-RADS criteria for biopsy or dedicated follow-up.   Nodule #3 is a cystic nodule in the superior right thyroid  lobe that measures up to 0.8 cm and does not require dedicated follow-up.   Nodule #4 is a mixed cystic and solid nodule in the mid right thyroid  lobe that measures 1.2 x 0.7 x 0.8 cm. Solid component is isoechoic. This nodule does not require dedicated follow-up.   Nodule # 5:   Prior biopsy: No   Location: Left; mid/superior   Maximum size: 1.5 cm; Other 2 dimensions: 1.4 x 0.8 cm, previously, 1.3 x 0.9 x 1.4 cm   Composition: solid/almost completely solid (2)   Echogenicity: hypoechoic (2)   Shape: not taller-than-wide (0)   Margins: smooth (0)   Echogenic foci: macrocalcifications (1)   ACR TI-RADS total points: 5.   ACR TI-RADS risk category:  TR4 (4-6 points).   Significant change in size (>/= 20% in two dimensions and minimal increase of 2 mm): No   Change in features: No   Change in ACR TI-RADS risk category: No   ACR TI-RADS recommendations:  Nodule has minimally changed since 2014 and most  compatible with a benign nodule.   _________________________________________________________   Nodule # 6:   Prior biopsy: No   Location: Left; Inferior   Maximum size: 4.7 cm; Other 2 dimensions: 2.1 x 3.4 cm, previously, 4.0 x 2.9 x 2.5 cm   Composition: solid/almost completely solid (2)   Echogenicity: hypoechoic (2)   Shape: not taller-than-wide (0)   Margins: smooth (0)   Echogenic foci: macrocalcifications (1)   ACR TI-RADS total points: 5.   ACR TI-RADS risk category:  TR4 (4-6 points).   Significant change in size (>/= 20% in two dimensions and minimal increase of 2 mm): Yes   Change in features: No   Change in ACR TI-RADS risk category: No   ACR TI-RADS recommendations:   **Given size (>/= 1.5 cm) and appearance, fine needle aspiration of this moderately suspicious nodule should be considered based on TI-RADS criteria.   _________________________________________________________   IMPRESSION: 1. Multinodular goiter. 2. Nodule #1 along the left side of the isthmus has significantly enlarged since 2014. This is a TR 4 nodule and recommend ultrasound-guided fine-needle aspiration of this nodule. 3. Nodule #6 in the inferior left thyroid  lobe has slightly enlarged since 2014. Consider ultrasound-guided biopsy of this nodule. 4. Nodule #5 in the left mid thyroid  is a TR 4 nodule but has not significantly changed since 2014. This is most likely a benign nodule.    FNA Left  isthmic 3.4 cm nodule 07/07/2022  Clinical History: Isthmus; Mid, Left 3.4cm;  Other 2 dimensions: 1.3 x  2.8cm, previously 0.7 x 0.4 x 0.8cm, solid/almost completely solid  hypoechoic TI-RADS - 4  Specimen Submitted:  A. 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 THYROID , LEFT INFERIOR, FINE NEEDLE ASPIRATION:    FINAL MICROSCOPIC DIAGNOSIS:  - Atypia of undetermined significance (Bethesda category III)    Afirma Benign   ASSESSMENT/PLAN/RECOMMENDATIONS:   Osteoporosis  -We emphasized the importance of optimizing calcium and vitamin D  - she was started on alendronate  through her PCP in 2023, she initially did not recall being on any medications for osteoporosis but she eventually stated that she did develop GI side effects -During her initial visit with me we discussed zoledronic acid versus Prolia, but she opted with Actonel  , but this was cost prohibitive and she opted to proceed with alendronate  - By April, 2025 she developed good and I have recommended zoledronic acid   Medications : Calcium 1200 mg daily With last 5 mg IV x 1   2.Hyperthyroidism  -Per patient, she has been on methimazole  for a few years - TFTs***    Medication Change methimazole  10 mg, 1.5 tab Sunday through Friday, 2 tabs on Saturday   3. MNG:  - NO locak neck symptoms  -She was diagnosed with multinodular goiter in 2014, and uptake and scan showed a toxic left thyroid  nodule -She is s/p isthmic 3.4 cm and left inferior 4.7 cm nodules 06/2022 with atypia of undetermined significance (Bethesda category IV) , Afirma was benign for both - Will proceed with repeat thyroid  ultrasound  4.  Vitamin D  insufficiency:  -Resolved  Continue OTC vitamin D3 4000 IU daily   Follow-up in 6 months   Signed electronically by: Natale Bail, MD  [facility] Endocrinology  [facility] [address] [place] [place], [place] [address] Phone: [phone] FAX: [phone]   CC: Elverna Hamman [address] [place] [address] Phone: [phone] Fax:  [phone]   Return to Endocrinology clinic as below: Future Appointments  Date Time Provider Department Center  02/24/2024  9:30 AM Rhyland Hinderliter, Julian Obey, MD [facility] None

## 2023-08-24 ENCOUNTER — Ambulatory Visit: Payer: Self-pay | Admitting: Internal Medicine

## 2023-08-24 LAB — TSH: TSH: 0.32 m[IU]/L — ABNORMAL LOW (ref 0.40–4.50)

## 2023-08-24 LAB — T4, FREE: Free T4: 1.1 ng/dL (ref 0.8–1.8)

## 2023-08-24 MED ORDER — METHIMAZOLE 10 MG PO TABS: 15.0000 mg | ORAL_TABLET | ORAL | 2 refills | Status: DC

## 2023-08-26 ENCOUNTER — Ambulatory Visit
Admission: RE | Admit: 2023-08-26 | Discharge: 2023-08-26 | Disposition: A | Discharge status: Home / Self Care | Source: Ambulatory Visit | Attending: Internal Medicine | Admitting: Internal Medicine

## 2023-08-26 DIAGNOSIS — E042 Nontoxic multinodular goiter: Secondary | ICD-10-CM

## 2023-09-01 ENCOUNTER — Ambulatory Visit

## 2023-09-01 VITALS — BP 115/70 | HR 61 | Temp 97.3°F | Resp 16 | Ht 64.0 in | Wt 198.0 lb

## 2023-09-01 DIAGNOSIS — M810 Age-related osteoporosis without current pathological fracture: Secondary | ICD-10-CM | POA: Diagnosis not present

## 2023-09-01 MED ORDER — ZOLEDRONIC ACID 5 MG/100ML IV SOLN
5.0000 mg | Freq: Once | INTRAVENOUS | Status: AC
  Administered 2023-09-01: 5 mg via INTRAVENOUS
  Filled 2023-09-01: qty 100

## 2023-09-01 MED ORDER — ACETAMINOPHEN 325 MG PO TABS
650.0000 mg | ORAL_TABLET | Freq: Once | ORAL | Status: AC
  Administered 2023-09-01: 650 mg via ORAL
  Filled 2023-09-01: qty 2

## 2023-09-01 MED ORDER — DIPHENHYDRAMINE HCL 25 MG PO CAPS
25.0000 mg | ORAL_CAPSULE | Freq: Once | ORAL | Status: AC
  Administered 2023-09-01: 25 mg via ORAL
  Filled 2023-09-01: qty 1

## 2023-09-01 NOTE — Patient Instructions (Signed)
 Zoledronic Acid Injection (Bone Disorders) Qu es este medicamento? El CIDO ZOLEDRNICO previene y trata la osteoporosis. Tambin se puede usar para tratar la enfermedad sea de [place]. Acta fortaleciendo los [place] y hacindolos menos propensos a quebrarse (fractura). Pertenece a un grupo de medicamentos llamados bifosfonatos. Este medicamento puede ser utilizado para otros usos; si tiene alguna pregunta consulte con su proveedor de atencin mdica o con su farmacutico. MARCAS COMUNES: Reclast Qu le debo informar a mi profesional de la salud antes de tomar este medicamento? Necesitan saber si usted presenta alguno de los siguientes problemas o situaciones: Trastorno de Sports coach Enfermedad renal Niveles bajos de calcio en la sangre Recuento bajo de glbulos rojos Enfermedad pulmonar o respiratoria, como asma Recibe medicamentos esteroideos, tales como dexametasona o prednisona Una reaccin alrgica o inusual al cido zoledrnico, a otros medicamentos, alimentos, colorantes o conservantes Si est embarazada o buscando quedar embarazada Si est amamantando a un beb Cmo debo utilizar este medicamento? Este medicamento se inyecta en una vena. Su equipo de atencin lo [place] en un hospital o en un entorno clnico. Se le entregar una Gua del medicamento (MedGuide, su nombre en ingls) especial antes de cada tratamiento. Asegrese de leer esta informacin cada vez cuidadosamente. Hable con su equipo de atencin sobre el uso de este medicamento en nios. Puede requerir atencin especial. Sobredosis: Pngase en contacto inmediatamente con un centro toxicolgico o una sala de urgencia si usted cree que haya tomado demasiado medicamento.<br>ATENCIN: [employer] es solo para usted. No comparta este medicamento con nadie. Qu sucede si me olvido de una dosis? Cumpla con las citas para dosis de seguimiento. Es importante no olvidar ninguna dosis. Llame a su  equipo de atencin si no puede asistir a una cita. Qu puede interactuar con este medicamento? Ciertos antibiticos inyectables AINE, medicamentos para el dolor y la inflamacin, tales como ibuprofeno, naproxeno Algunos diurticos, tales como bumetanida, furosemida Teriparatida Puede ser que esta lista no menciona todas las posibles interacciones. Informe a su profesional de [employer] de [employer] productos a base de hierbas, medicamentos de [place] o suplementos nutritivos que est tomando. Si usted fuma, consume bebidas alcohlicas o si utiliza drogas ilegales, indqueselo tambin a su profesional de [employer]. Algunas sustancias pueden interactuar con su medicamento. A qu debo estar atento al usar [employer]? Visite a su equipo de atencin para que revise su evolucin peridicamente. Es posible que pase un tiempo antes de que pueda notar los beneficios de [place]. Algunas personas que toman este medicamento tienen dolor intenso en huesos, articulaciones o msculos. Este medicamento tambin puede aumentar su riesgo de problemas en la mandbula o de tener una fractura de fmur. Informe a su equipo de atencin de inmediato si tiene dolor intenso en la mandbula, los [place], las articulaciones o los msculos. Informe a su equipo de atencin si tiene algn dolor que no desaparece o que empeora. Debe asegurarse de recibir suficiente calcio y vitamina D mientras usa  este medicamento. Converse con su equipo de atencin EchoStar que come y las vitaminas que toma. Usted podra necesitar realizarse anlisis de sangre mientras est usando este medicamento. Informe a su dentista y a su Airline pilot que est usando este medicamento. No deben realizarle una ciruga dental mayor mientras usa  este medicamento. Antes de comenzar a Producer, television/film/video, visite a su dentista para Physicist, medical un examen y arregle TEFL teacher. Cuide bien sus dientes mientras usa  este  medicamento. Asegrese de visitar a  su dentista para citas de seguimiento peridicas. Qu efectos secundarios puedo tener al [employer] este medicamento? Efectos secundarios que debe informar a su equipo de atencin tan pronto como sea posible: Reacciones alrgicas: erupcin cutnea, comezn/picazn, urticaria, hinchazn de la cara, los labios, la lengua o la garganta Lesin en los riones: disminucin en la cantidad de orina, hinchazn de los tobillos, las manos o los pies Nivel bajo de calcio: dolor o calambres musculares, confusin, hormigueo o entumecimiento en manos o pies Osteonecrosis de la mandbula: dolor, hinchazn o enrojecimiento en la boca, entumecimiento de la [place], mala cicatrizacin despus de un trabajo odontolgico, secrecin inusual de la boca, huesos visibles en la boca Dolor intenso en los [place], las articulaciones o los msculos Efectos secundarios que generalmente no requieren atencin mdica (debe informarlos a su equipo de atencin si persisten o si son molestos): Diarrea Mareos Dolor de cabeza Nuseas Dolor estomacal Vmito Puede ser que esta lista no menciona todos los posibles efectos secundarios. Comunquese a su mdico por asesoramiento mdico [employer]. Usted puede informar los efectos secundarios a la FDA por telfono al 1-800-FDA-1088. Dnde debo guardar mi medicina? Este medicamento se administra en hospitales o clnicas. No se guarda en su casa. ATENCIN: Este folleto es un resumen. Puede ser que no cubra toda la posible informacin. Si usted tiene preguntas acerca de esta medicina, consulte con su mdico, su farmacutico o su profesional de Radiographer, therapeutic.  2024 Elsevier/Gold Standard (2021-07-30 00:00:00)

## 2023-09-01 NOTE — Progress Notes (Signed)
 Diagnosis: Osteoporosis  Provider:  Phyllis Breeze MD  Procedure: IV Infusion  IV Type: Peripheral, IV Location: R Upper Arm  Reclast (Zolendronic Acid), Dose: 5 mg  Infusion Start Time: 1036  Infusion Stop Time: 1111  Post Infusion IV Care: Observation period completed and Peripheral IV Discontinued  Discharge: Condition: Good, Destination: Home . AVS Declined  Performed by:  Lauran Pollard, LPN

## 2023-10-22 ENCOUNTER — Telehealth: Payer: Self-pay

## 2023-10-22 NOTE — Telephone Encounter (Signed)
 complete

## 2024-01-29 ENCOUNTER — Other Ambulatory Visit: Payer: Self-pay

## 2024-01-29 ENCOUNTER — Emergency Department

## 2024-01-29 ENCOUNTER — Emergency Department
Admission: EM | Admit: 2024-01-29 | Discharge: 2024-01-29 | Disposition: A | Discharge status: Home / Self Care | Attending: Emergency Medicine | Admitting: Emergency Medicine

## 2024-01-29 DIAGNOSIS — S8991XA Unspecified injury of right lower leg, initial encounter: Secondary | ICD-10-CM | POA: Diagnosis present

## 2024-01-29 DIAGNOSIS — M25561 Pain in right knee: Secondary | ICD-10-CM

## 2024-01-29 DIAGNOSIS — S81011A Laceration without foreign body, right knee, initial encounter: Secondary | ICD-10-CM | POA: Insufficient documentation

## 2024-01-29 DIAGNOSIS — W010XXA Fall on same level from slipping, tripping and stumbling without subsequent striking against object, initial encounter: Secondary | ICD-10-CM | POA: Insufficient documentation

## 2024-01-29 DIAGNOSIS — E119 Type 2 diabetes mellitus without complications: Secondary | ICD-10-CM | POA: Insufficient documentation

## 2024-01-29 DIAGNOSIS — I10 Essential (primary) hypertension: Secondary | ICD-10-CM | POA: Diagnosis not present

## 2024-01-29 HISTORY — DX: Disorder of thyroid, unspecified: E07.9

## 2024-01-29 HISTORY — DX: Essential (primary) hypertension: I10

## 2024-01-29 HISTORY — DX: Type 2 diabetes mellitus without complications: E11.9

## 2024-01-29 MED ORDER — LIDOCAINE HCL (PF) 1 % IJ SOLN: 5.0000 mL | Freq: Once | INTRAMUSCULAR | Status: DC

## 2024-01-29 MED ORDER — LIDOCAINE HCL (PF) 1 % IJ SOLN
30.0000 mL | Freq: Once | INTRAMUSCULAR | Status: DC
  Administered 2024-01-29: 30 mL
  Filled 2024-01-29: qty 30

## 2024-01-29 MED ORDER — OXYCODONE-ACETAMINOPHEN 5-325 MG PO TABS
1.0000 | ORAL_TABLET | Freq: Once | ORAL | Status: AC
  Administered 2024-01-29: 1 via ORAL
  Filled 2024-01-29: qty 1

## 2024-01-29 NOTE — ED Triage Notes (Signed)
 Pt to ED with husband POV for R knee laceration from mechanical fall today while tripping over cat. Fell onto knee from 1 stair. Has laceration across whole knee--has photo on phone. Currently wrapped. Hx knee surgery to same side >10 years ago. Painful to bend knee. Spanish speaking.

## 2024-01-29 NOTE — Discharge Instructions (Addendum)
 Please be sure to keep the area clean and dry for 2 days.  Please will not soak or go into any body of water until the laceration is completely healed and the sutures are removed.  After 2 days, you can cover the suture site with Vaseline, do not use Neosporin or bacitracin on the wounds.  For scarring, you can also apply sunblock after 2 days.  Please ensure to follow-up with your primary care doctor to get the sutures removed in 10 to 14 days.  If you see any redness, purulent drainage, have increasing pain, fever, or if you have any additional concerns, please return to the emergency department.

## 2024-01-29 NOTE — ED Provider Notes (Signed)
   [facility] Provider Note    Event Date/Time   First MD Initiated Contact with Patient 01/29/24 1430     (approximate)   History    Physical Exam     PROCEDURES:  Critical Care performed:   .Laceration Repair  Date/Time: 01/29/2024 6:10 PM  Performed by: Janit Kast, PA-C Authorized by: Janit Kast, PA-C   Consent:    Consent obtained:  Verbal   Consent given by:  Patient   Risks discussed:  Infection and pain Universal protocol:    Procedure explained and questions answered to patient or proxy's satisfaction: yes     Patient identity confirmed:  Verbally with patient Laceration details:    Location:  Leg   Leg location:  R knee   Length (cm):  20   Depth (mm):  4 Pre-procedure details:    Preparation:  Patient was prepped and draped in usual sterile fashion Exploration:    Limited defect created (wound extended): no     Hemostasis achieved with:  Direct pressure   Imaging obtained: x-ray     Imaging outcome: foreign body not noted     Wound extent: muscle damage     Contaminated: no   Treatment:    Area cleansed with:  Chlorhexidine   Amount of cleaning:  Extensive   Irrigation solution:  Sterile saline   Irrigation volume:  400   Irrigation method:  Pressure wash   Visualized foreign bodies/material removed: no     Debridement:  None   Undermining:  None   Scar revision: no     Layers/structures repaired:  Deep subcutaneous Deep subcutaneous:    Suture size:  5-0   Suture material:  Monocryl   Suture technique:  Simple interrupted   Number of sutures:  6 Skin repair:    Repair method:  Sutures   Suture size:  5-0   Suture material:  Nylon   Suture technique:  Simple interrupted   Number of sutures:  15 Approximation:    Approximation:  Close Repair type:    Repair type:  Simple Post-procedure details:    Dressing:  Non-adherent dressing and adhesive bandage   Procedure completion:  Tolerated well, no  immediate complications             Janit Kast, PA-C 01/29/24 1813    Waymond Lorelle Cummins, MD 01/29/24 [phone]

## 2024-01-29 NOTE — ED Provider Notes (Signed)
 [facility] Provider Note    Event Date/Time   First MD Initiated Contact with Patient 01/29/24 1430     (approximate)   History   Laceration and Knee Injury   HPI  Katherine Walters is a 68 y.o. female with history of diabetes, hypertension, not on anticoagulation presenting with right knee pain after a fall.  Patient states that she tripped over her cat, fell onto her right knee, states that she was wearing pants, fell to concrete floor that was just clean.  Her tetanus is up-to-date.  States that she has had prior surgery to fix her patella more than a decade ago, states that the laceration she sustained from this fall is along the surgical site of her prior repair.  Is concerned that she broke her patella.  No focal weakness or numbness, no pain anywhere else.  States that she did not injure anywhere else, no head strike or LOC.  On independent chart review, he was seen for primary care in June, has history of multi nodular goiter, on methimazole , alendronate , vitamin D .  Is on baby aspirin but no other blood thinners on his med list.  He received his last tetanus in 2017.    Encounter was done in Spanish interpreter.  Physical Exam   Triage Vital Signs: ED Triage Vitals  Encounter Vitals Group     BP 01/29/24 1414 (!) 163/75     Girls Systolic BP Percentile --      Girls Diastolic BP Percentile --      Boys Systolic BP Percentile --      Boys Diastolic BP Percentile --      Pulse Rate 01/29/24 1414 84     Resp 01/29/24 1414 20     Temp 01/29/24 1414 98.5 F (36.9 C)     Temp Source 01/29/24 1414 Oral     SpO2 01/29/24 1414 99 %     Weight 01/29/24 1422 202 lb 13.2 oz (92 kg)     Height 01/29/24 1422 5' 3.78 (1.62 m)     Head Circumference --      Peak Flow --      Pain Score 01/29/24 1412 7     Pain Loc --      Pain Education --      Exclude from Growth Chart --     Most recent vital signs: Vitals:   01/29/24 1414  BP: (!) 163/75   Pulse: 84  Resp: 20  Temp: 98.5 F (36.9 C)  SpO2: 99%     General: Awake, no distress.  CV:  Good peripheral perfusion.  Resp:  Normal effort.  No thoracic cage tenderness Abd:  No distention.  Soft nontender Other:  Will fully range her bilateral upper extremities and left lower extremity, able to range her right hip, ankle, unable to fully range her right knee due to pain, she does have a 10 cm laceration site of the cross her right knee overlying the patella, no tibial plateau tenderness, no tenderness to the rest of her leg.   ED Results / Procedures / Treatments   Labs (all labs ordered are listed, but only abnormal results are displayed) Labs Reviewed - No data to display     RADIOLOGY On my independent interpretation, x-ray without obvious fractures   PROCEDURES:  Critical Care performed: No  Procedures   MEDICATIONS ORDERED IN ED: Medications  oxyCODONE-acetaminophen  (PERCOCET/ROXICET) 5-325 MG per tablet 1 tablet (1 tablet Oral Given  01/29/24 1506)     IMPRESSION / MDM / ASSESSMENT AND PLAN / ED COURSE  I reviewed the triage vital signs and the nursing notes.                              Differential diagnosis includes, but is not limited to, laceration, fracture, contusion, strain, sprain, ligamentous injury, consider open fracture.  Will get x-rays of his knee, plan to suture.  Tetanus is up-to-date.  Will give her a Percocet for pain.  Patient's presentation is most consistent with acute presentation with potential threat to life or bodily function.  Independent interpretation of labs and imaging below.  Patient had a laceration repaired, post laceration care was discussed, she can follow-up primary care to get her sutures removed in 10 to 14 days.  Considered but no indication for inpatient admission at this time, she is safe for outpatient management.  Discharged with strict return precautions.    Clinical Course as of 01/29/24 1810  Sat Jan 29, 2024  1516 DG Knee Complete 4 Views Right IMPRESSION: 1. Interval fixation of patellar fracture fragments in near anatomic alignment. 2. Soft tissue defect overlying the patella without definite intraarticular gas. 3. Progressive DJD.   [TT]  1605 CT Knee Right Wo Contrast IMPRESSION: 1. Prepatellar soft tissue laceration with mild surrounding subcutaneous edema and emphysema. No unexpected foreign body or focal fluid collection identified. 2. No evidence of acute fracture or dislocation. 3. Advanced tricompartmental degenerative changes, progressive from 2013. Small knee joint effusion with small intra-articular loose bodies.   [TT]    Clinical Course User Index [TT] Waymond Lorelle Cummins, MD     FINAL CLINICAL IMPRESSION(S) / ED DIAGNOSES   Final diagnoses:  Acute pain of right knee  Laceration of right knee, initial encounter     Rx / DC Orders   ED Discharge Orders     None        Note:  This document was prepared using Dragon voice recognition software and may include unintentional dictation errors.    Waymond Lorelle Cummins, MD 01/29/24 [phone]

## 2024-02-24 ENCOUNTER — Other Ambulatory Visit

## 2024-02-24 ENCOUNTER — Encounter: Payer: Self-pay | Admitting: Internal Medicine

## 2024-02-24 ENCOUNTER — Ambulatory Visit: Admitting: Internal Medicine

## 2024-02-24 VITALS — BP 148/82 | Ht 67.0 in | Wt 201.0 lb

## 2024-02-24 DIAGNOSIS — E042 Nontoxic multinodular goiter: Secondary | ICD-10-CM | POA: Diagnosis not present

## 2024-02-24 DIAGNOSIS — M810 Age-related osteoporosis without current pathological fracture: Secondary | ICD-10-CM | POA: Diagnosis not present

## 2024-02-24 DIAGNOSIS — E0590 Thyrotoxicosis, unspecified without thyrotoxic crisis or storm: Secondary | ICD-10-CM | POA: Diagnosis not present

## 2024-02-24 NOTE — Progress Notes (Unsigned)
 Name: Katherine Walters  MRN/ DOB: [id number], [birth year]    Age/ Sex: 68 y.o., female    PCP: Norvell Boyer, DEVONNA   Reason for Endocrinology Evaluation: Osteoporosis     Date of Initial Endocrinology Evaluation: 06/10/2022    HPI: Katherine Walters is a 68 y.o. female with a past medical history of HTN, DM, MNG, GERD. The patient presented for initial endocrinology clinic visit on 06/10/2022 for consultative assistance with her osteoporosis.     Pt was diagnosed with osteoporosis: 08/2022  Menarche at age : 56 Menopausal at age : hysterectomy at age 68 Fracture Hx: Right patellar fracture after a slip injury  Hx of HRT: no FH of osteoporosis or hip fracture: no Prior Hx of anti-estrogenic therapy :no Prior Hx of anti-resorptive therapy : Alendronate  08/2021 but stopped it due GERD .  On her initial visit to our clinic 05/2022 I prescribed to donate, but the patient requested to change this to alendronate  due to cost.  She has been on alendronate  since 08/21/2022, but she discontinued by 06/2023 due to GERD   Patient did receive Reclast  infusion 09/01/2023   THYROID  HISTORY: Of note, the patient has been diagnosed with multinodular goiter based on ultrasound 10/2012.  The largest hypoechoic nodule in the left inferior thyroid  pole 2.5 x 2.9 x 4 cm .  She underwent thyroid  uptake and scan 07/2013 with large hyperfunctioning nodule within the left lobe of the thyroid  gland.  With suppression of the right lobe of the thyroid  gland.   She has been on methimazole  since 2021    She is s/p FNA of the left isthmic 3.4 cm and left inferior 4.7 cm nodules/2024 with atypia of undetermined significance (Bethesda category III) , Afirma benign for both    Denies Fh of thyroid  disease     SUBJECTIVE:    Today (02/24/2024):  Katherine Walters is here for follow-up on osteoporosis and multinodular goiter.  Interpreter line used  luis # [id number]  She received Reclast  infusion  09/01/2023 She did sustain a right knee injury with laceration following a fall.  No fractures. Has re-suturing of the incision  She was evaluated through the [facility] clinic and was started on doxycycline  She has noted inflammatory neck lymphadenopathy , no pain, has occasional sore throat   Continues with occasional palpitations  No constipation or diarrhea  She continues with fatigue   She continues to consume calcium through the food Change methimazole  10 mg, 2 tab on Saturdays and Sundays and continue to take 1.5 tablets rest of the week  Vitamin D3 4000 IU daily     HISTORY:  Past Medical History:  Past Medical History:  Diagnosis Date   Diabetes mellitus without complication (HCC)    Hypertension    Thyroid  disease    hyper   Past Surgical History:  Past Surgical History:  Procedure Laterality Date   ABDOMINAL HYSTERECTOMY     COLONOSCOPY WITH PROPOFOL  N/A 10/19/2022   Procedure: COLONOSCOPY WITH PROPOFOL ;  Surgeon: Therisa Bi, MD;  Location: [facility] ENDOSCOPY;  Service: Gastroenterology;  Laterality: N/A;  Spanish interpreter   POLYPECTOMY  10/19/2022   Procedure: POLYPECTOMY;  Surgeon: Therisa Bi, MD;  Location: [facility] ENDOSCOPY;  Service: Gastroenterology;;    Social History:  reports that she has never smoked. She has never used smokeless tobacco. She reports that she does not currently use alcohol. She reports that she does not use drugs. Family History: family history is not  on file.   HOME MEDICATIONS: Allergies as of 02/24/2024       Reactions   Iodine Other (See Comments)        Medication List        Accurate as of February 24, 2024  9:49 AM. If you have any questions, ask your nurse or doctor.          alendronate  70 MG tablet Commonly known as: FOSAMAX  Take 1 tablet (70 mg total) by mouth every 7 (seven) days. Take with a full glass of water on an empty stomach.   aspirin 81 MG chewable tablet Chew by mouth.   atorvastatin 80 MG  tablet Commonly known as: LIPITOR Take 80 mg by mouth daily.   cetirizine 10 MG tablet Commonly known as: ZYRTEC Take by mouth.   chlorhexidine 0.12 % solution Commonly known as: PERIDEX   FreeStyle Libre 2 Sensor Misc   Jardiance 25 MG Tabs tablet Generic drug: empagliflozin Take 25 mg by mouth daily.   Lantus SoloStar 100 UNIT/ML Solostar Pen Generic drug: insulin glargine Inject into the skin.   lisinopril-hydrochlorothiazide 20-25 MG tablet Commonly known as: ZESTORETIC TAKE 1 TABLET BY MOUTH ONCE DAILY NEEDS  FOLLOW  UP  FOR  FURTHER  REFILLS   lisinopril-hydrochlorothiazide 20-12.5 MG tablet Commonly known as: ZESTORETIC Take 1 tablet by mouth 2 (two) times daily.   meloxicam 7.5 MG tablet Commonly known as: MOBIC Take 7.5 mg by mouth daily.   methimazole  10 MG tablet Commonly known as: TAPAZOLE  Take 1.5 tablets (15 mg total) by mouth as directed. 1.5 tablets Monday through Friday, 2 tablets on Saturday and Sunday   methocarbamol 500 MG tablet Commonly known as: ROBAXIN Take 500 mg by mouth 4 (four) times daily as needed.   rosuvastatin 40 MG tablet Commonly known as: CRESTOR Take 40 mg by mouth daily.   terbinafine 250 MG tablet Commonly known as: LAMISIL Take 250 mg by mouth daily.   triamcinolone cream 0.1 % Commonly known as: KENALOG SMARTSIG:1 Sparingly Topical Twice Daily   TRUEplus 5-Bevel Pen Needles 31G X 6 MM Misc Generic drug: Insulin Pen Needle   Victoza 18 MG/3ML Sopn Generic drug: liraglutide Inject 1.2 mg into the skin daily.   Vitamin D3 50 MCG (2000 UT) capsule Take 1 capsule (2,000 Units total) by mouth daily.          REVIEW OF SYSTEMS: A comprehensive ROS was conducted with the patient and is negative except as per HPI    OBJECTIVE:  VS: BP (!) 148/82   Ht 5' 7 (1.702 m)   Wt 201 lb (91.2 kg)   BMI 31.48 kg/m    Wt Readings from Last 3 Encounters:  02/24/24 201 lb (91.2 kg)  01/29/24 202 lb 13.2 oz (92 kg)   09/01/23 198 lb (89.8 kg)     EXAM: General: Pt appears well and is in NAD  Neck: General: Prominent submandibular saliva gland noted. Thyroid : Unable to appreciate any  goiter.    Lungs: Clear with good BS bilat   Heart: Auscultation: RRR.  Abdomen:  soft, nontender  Extremities:  BL LE: No pretibial edema normal  Mental Status: Judgment, insight: Intact Orientation: Oriented to time, place, and person Mood and affect: No depression, anxiety, or agitation     DATA REVIEWED:    ***     DXA 08/14/2021 @[facility] [phone] AP spine -2.2 Left femoral neck -2.5 Left total hip -0.8    Thyroid  ultrasound 08/28/2023 EXAM: THYROID   ULTRASOUND   TECHNIQUE: Ultrasound examination of the thyroid  gland and adjacent soft tissues was performed.   COMPARISON:  Prior thyroid  ultrasound 06/22/2022 and 11/07/2012   FINDINGS: Parenchymal Echotexture: Markedly heterogenous   Isthmus: 0.4 cm   Right lobe: 4.3 x 1.1 x 1.5 cm   Left lobe: 6.1 x 3.2 x 3.4 cm   _________________________________________________________   Estimated total number of nodules >/= 1 cm: 4   Number of spongiform nodules >/=  2 cm not described below (TR1): 0   Number of mixed cystic and solid nodules >/= 1.5 cm not described below (TR2): 0   _________________________________________________________   Nodule # 1: Previously biopsied isthmic mass measures approximately 3.4 x 3.3 x 1.8 cm, minimally changed compared to prior.   Nodule # 2: Predominantly cystic nodule in the right upper gland measures up to 1.2 cm. Sonographically low risk/benign. No further follow-up.   Nodule # 3: Predominantly cystic nodule in the right mid gland measures up to 1.3 cm. Low risk/benign. No further follow-up.   Nodule # 4: Subcentimeter nodule in the right lower gland. No further follow-up.   Nodule # 5: Previously biopsied nodule occupying the majority of the left lower gland is grossly  unchanged at 5.0 x 3.8 x 2.5 cm.   No new nodules or suspicious features.   IMPRESSION: 1. No significant interval change in the size or appearance of the previously biopsied nodules in the thyroid  isthmus and left lower gland. 2. Additional sonographically low risk/benign lesions again noted in the right gland. As before, none meet criteria to recommend biopsy or dedicated imaging surveillance. 3. No new nodules.    FNA Left  isthmic 3.4 cm nodule 07/07/2022  Clinical History: Isthmus; Mid, Left 3.4cm;  Other 2 dimensions: 1.3 x  2.8cm, previously 0.7 x 0.4 x 0.8cm, solid/almost completely solid  hypoechoic TI-RADS - 4  Specimen Submitted:  A. 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 THYROID , LEFT INFERIOR, FINE NEEDLE ASPIRATION:    FINAL MICROSCOPIC DIAGNOSIS:  - Atypia of undetermined significance (Bethesda category III)    Afirma Benign   ASSESSMENT/PLAN/RECOMMENDATIONS:   Osteoporosis  - She was started on alendronate  through her PCP in 2023, she initially did not recall being on any medications for osteoporosis but she eventually stated that she did develop GI side effects -During her initial visit with me we discussed zoledronic  acid versus Prolia, but she opted with Actonel  , but this was cost prohibitive and she opted to proceed with alendronate  - By April, 2025 she developed GERD symptoms with alendronate  - She received her first Reclast  infusion in June, 2025 - Emphasized the importance of optimizing calcium and vitamin D  intake as well as weightbearing exercises  Medications  : Calcium 1200 mg daily Reclast  infusion 5 mg IV x 1 ~ 08/2023   2.Hyperthyroidism  -Per patient, she has been on methimazole  for a few years - 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 MNG:  - No local neck symptoms -She was diagnosed with multinodular goiter in 2014, and uptake and scan showed a toxic left thyroid  nodule -She is s/p isthmic 3.4 cm and left inferior 4.7 cm nodules 06/2022 with atypia of undetermined significance (Bethesda category IV) , Afirma was benign for both - Up-to-date on thyroid  ultrasound    4.  Vitamin D  insufficiency:  -****  Continue OTC vitamin D3 4000 IU daily   Follow-up in 6 months   Signed electronically by: Stefano Redgie Butts, MD  [facility] Endocrinology  [facility] [address] [place], [place] [address] Phone: [phone] FAX: [phone]   CC: Norvell Verneita RIGGERS [address] [place] [address] Phone: [phone] Fax: [phone]   Return to Endocrinology clinic as below: No future appointments.

## 2024-02-25 ENCOUNTER — Ambulatory Visit: Payer: Self-pay | Admitting: Internal Medicine

## 2024-02-25 LAB — PARATHYROID HORMONE, INTACT (NO CA): PTH: 36 pg/mL (ref 16–77)

## 2024-02-25 LAB — TSH: TSH: 0.29 m[IU]/L — ABNORMAL LOW (ref 0.40–4.50)

## 2024-02-25 LAB — HEPATIC FUNCTION PANEL
AG Ratio: 2 (calc) (ref 1.0–2.5)
ALT: 16 U/L (ref 6–29)
AST: 14 U/L (ref 10–35)
Albumin: 4.4 g/dL (ref 3.6–5.1)
Alkaline phosphatase (APISO): 91 U/L (ref 37–153)
Bilirubin, Direct: 0.1 mg/dL (ref 0.0–0.2)
Globulin: 2.2 g/dL (ref 1.9–3.7)
Indirect Bilirubin: 0.4 mg/dL (ref 0.2–1.2)
Total Bilirubin: 0.5 mg/dL (ref 0.2–1.2)
Total Protein: 6.6 g/dL (ref 6.1–8.1)

## 2024-02-25 LAB — VITAMIN D 25 HYDROXY (VIT D DEFICIENCY, FRACTURES): Vit D, 25-Hydroxy: 40 ng/mL (ref 30–100)

## 2024-02-25 LAB — T4, FREE: Free T4: 1 ng/dL (ref 0.8–1.8)

## 2024-02-25 MED ORDER — METHIMAZOLE 10 MG PO TABS: 10.0000 mg | ORAL_TABLET | ORAL | 2 refills | Status: AC

## 2024-02-25 NOTE — Telephone Encounter (Signed)
 Please use the interpreter line and give the pt this message    Thyroid  continues to show slight overactivity of the thyroid  . Please change Methimazole  to TWO tablets on Friday, Saturday and Sunday and take 1.5 tablets Monday through Thursdays   Vitamin D  and parathyroid  hormone as well as your liver tests have all come back normal      Thanks

## 2024-02-28 NOTE — Telephone Encounter (Signed)
 Using interpreter advised patient of medication change and results.

## 2024-08-23 ENCOUNTER — Ambulatory Visit: Admitting: Internal Medicine
